# Patient Record
Sex: Male | Born: 2000 | Race: White | Hispanic: Yes | Marital: Single | Smoking: Current every day smoker
Health system: Southern US, Community
[De-identification: ages and names within clinical notes are randomized; demographics above are authoritative.]

## PROBLEM LIST (undated history)

## (undated) DIAGNOSIS — J309 Allergic rhinitis, unspecified: Secondary | ICD-10-CM

## (undated) DIAGNOSIS — K760 Fatty (change of) liver, not elsewhere classified: Secondary | ICD-10-CM

## (undated) DIAGNOSIS — L309 Dermatitis, unspecified: Secondary | ICD-10-CM

## (undated) DIAGNOSIS — K7689 Other specified diseases of liver: Secondary | ICD-10-CM

## (undated) DIAGNOSIS — K219 Gastro-esophageal reflux disease without esophagitis: Secondary | ICD-10-CM

## (undated) DIAGNOSIS — K741 Hepatic sclerosis: Secondary | ICD-10-CM

## (undated) DIAGNOSIS — K5900 Constipation, unspecified: Secondary | ICD-10-CM

## (undated) DIAGNOSIS — E663 Overweight: Secondary | ICD-10-CM

## (undated) HISTORY — DX: Fatty (change of) liver, not elsewhere classified: K76.0

## (undated) HISTORY — PX: LIVER BIOPSY: SHX301

## (undated) HISTORY — DX: Dermatitis, unspecified: L30.9

## (undated) HISTORY — DX: Constipation, unspecified: K59.00

## (undated) HISTORY — DX: Allergic rhinitis, unspecified: J30.9

## (undated) HISTORY — DX: Gastro-esophageal reflux disease without esophagitis: K21.9

## (undated) HISTORY — DX: Overweight: E66.3

---
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
## 2022-08-21 ENCOUNTER — Ambulatory Visit (INDEPENDENT_AMBULATORY_CARE_PROVIDER_SITE_OTHER): Payer: Medicaid Other | Admitting: Nurse Practitioner

## 2022-08-21 ENCOUNTER — Encounter: Payer: Self-pay | Admitting: Nurse Practitioner

## 2022-08-21 VITALS — BP 122/84 | HR 86 | Temp 97.4°F | Ht 72.0 in | Wt 280.0 lb

## 2022-08-21 DIAGNOSIS — E669 Obesity, unspecified: Secondary | ICD-10-CM | POA: Diagnosis not present

## 2022-08-21 DIAGNOSIS — Z6837 Body mass index (BMI) 37.0-37.9, adult: Secondary | ICD-10-CM | POA: Diagnosis not present

## 2022-08-21 DIAGNOSIS — J301 Allergic rhinitis due to pollen: Secondary | ICD-10-CM

## 2022-08-21 DIAGNOSIS — L83 Acanthosis nigricans: Secondary | ICD-10-CM | POA: Diagnosis not present

## 2022-08-21 DIAGNOSIS — K219 Gastro-esophageal reflux disease without esophagitis: Secondary | ICD-10-CM | POA: Diagnosis not present

## 2022-08-21 DIAGNOSIS — K760 Fatty (change of) liver, not elsewhere classified: Secondary | ICD-10-CM | POA: Diagnosis not present

## 2022-08-21 LAB — COMPREHENSIVE METABOLIC PANEL
ALT: 295 U/L — ABNORMAL HIGH (ref 0–53)
AST: 116 U/L — ABNORMAL HIGH (ref 0–37)
Albumin: 4.5 g/dL (ref 3.5–5.2)
Alkaline Phosphatase: 101 U/L (ref 52–171)
BUN: 12 mg/dL (ref 6–23)
CO2: 24 mEq/L (ref 19–32)
Calcium: 9.7 mg/dL (ref 8.4–10.5)
Chloride: 106 mEq/L (ref 96–112)
Creatinine, Ser: 0.73 mg/dL (ref 0.40–1.50)
GFR: 131.47 mL/min (ref >60.00)
Glucose, Bld: 108 mg/dL — ABNORMAL HIGH (ref 70–99)
Potassium: 4 mEq/L (ref 3.5–5.1)
Sodium: 140 mEq/L (ref 135–145)
Total Bilirubin: 0.6 mg/dL (ref 0.2–1.2)
Total Protein: 7.2 g/dL (ref 6.0–8.3)

## 2022-08-21 LAB — CBC
HCT: 45.2 % (ref 36.0–49.0)
Hemoglobin: 15.9 g/dL (ref 12.0–16.0)
MCHC: 35.1 g/dL (ref 31.0–37.0)
MCV: 88.3 fl (ref 78.0–98.0)
Platelets: 284 10*3/uL (ref 150.0–575.0)
RBC: 5.12 Mil/uL (ref 3.80–5.70)
RDW: 13.1 % (ref 11.4–15.5)
WBC: 7.9 10*3/uL (ref 4.5–13.5)

## 2022-08-21 LAB — HEMOGLOBIN A1C: Hgb A1c MFr Bld: 6.1 % (ref 4.6–6.5)

## 2022-08-21 MED ORDER — LORATADINE 10 MG PO TABS: 10.0000 mg | ORAL_TABLET | Freq: Every day | ORAL | 3 refills | Status: DC

## 2022-08-21 MED ORDER — FLUTICASONE PROPIONATE 50 MCG/ACT NA SUSP: 2.0000 | Freq: Every day | NASAL | 6 refills | Status: AC

## 2022-08-21 MED ORDER — FAMOTIDINE 20 MG PO TABS
20.0000 mg | ORAL_TABLET | Freq: Every day | ORAL | 1 refills | Status: DC
Start: 2022-08-21 — End: 2022-11-26

## 2022-08-21 NOTE — Patient Instructions (Signed)
It was great to see you!  Try to eat out less often and eat more home cooked meals.   I have placed a referral to a nutritionist.  We are checking your labs today and will let you know the results via mychart/phone.   I am going to send in claritin and flonase for your allergies  Let's follow-up in 3 months, sooner if you have concerns.  If a referral was placed today, you will be contacted for an appointment. Please note that routine referrals can sometimes take up to 3-4 weeks to process. Please call our office if you haven't heard anything after this time frame.  Take care,  Rodman Pickle, NP

## 2022-08-21 NOTE — Assessment & Plan Note (Signed)
Chronic, not controlled.  He states that Zyrtec is helping some, however makes him sleepy.  Will have him switch to loratadine 10 mg daily and start Flonase nasal spray daily.  Follow-up symptoms worsen or do not improve.

## 2022-08-21 NOTE — Assessment & Plan Note (Signed)
Chronic, ongoing.  He states that he tries to avoid spicy foods and to limit, how much he is eating.  Will have him restart Pepcid 20 mg daily as needed for symptoms.

## 2022-08-21 NOTE — Assessment & Plan Note (Signed)
Will check A1c today.  Was normal in the past, however has gained 70 pounds in the last year.

## 2022-08-21 NOTE — Progress Notes (Signed)
New Patient Visit  BP 122/84 (BP Location: Left Arm)   Pulse 86   Temp (!) 97.4 F (36.3 C)   Ht 6' (1.829 m)   Wt 280 lb (127 kg)   SpO2 95%   BMI 37.97 kg/m    Subjective:    Patient ID: Ronald Phillips, male    DOB: February 22, 2001, 22 y.o.   MRN: [id number]  CC: Chief Complaint  Patient presents with   Establish Care    NP. Est. Care, concerns with allergies    HPI: Ronald Phillips is a 22 y.o. male presents for new patient visit to establish care.  Introduced to Publishing rights manager role and practice setting.  All questions answered.  Discussed provider/patient relationship and expectations.  He has a history of NAFLD. He was following with pediatric GI with [facility], however they stopped calling him and he is no longer following with them. He last saw them about 1-2 years ago. He has had a liver biopsy and fibrosure scan in the past. He states that he has gained about 70 pounds over the last year. He started eating out more at restaurants.   He has trouble with seasonal allergies. He endorses nasal congestion and itchy eyes. He is taking zyrtec, but it makes him sleepy. He denies coughing and post nasal drip.   He has a history of GERD. He is not currently taking anything for this. If he eats too much food, eats too fast, or spicy foods, it will bother him. He has taken pepcid in the past and would like to restart this medications.   Depression and Anxiety Screen done:     08/21/2022   10:21 AM 03/28/2018   11:34 AM  Depression screen PHQ 2/9  Decreased Interest 0 0  Down, Depressed, Hopeless 0 0  PHQ - 2 Score 0 0  Altered sleeping 1 2  Tired, decreased energy 0 2  Change in appetite 1 1  Feeling bad or failure about yourself  0 0  Trouble concentrating 1 1  Moving slowly or fidgety/restless 0 0  Suicidal thoughts 0   PHQ-9 Score 3 6      08/21/2022   10:23 AM  GAD 7 : Generalized Anxiety Score  Nervous, Anxious, on Edge 1  Control/stop worrying 0  Worry too  much - different things 0  Trouble relaxing 0  Restless 0  Easily annoyed or irritable 1  Afraid - awful might happen 0  Total GAD 7 Score 2     Past Medical History:  Diagnosis Date   Allergic rhinitis 09/03/2012   Constipation 09/03/2012   Rx Miralax.  Instructions re: eat more fiber.     Eczema    Fatty liver    GERD (gastroesophageal reflux disease)    Hardening of the liver    Overweight(278.02) 09/03/2012    Past Surgical History:  Procedure Laterality Date   LIVER BIOPSY      Family History  Problem Relation Age of Onset   Healthy Mother    Healthy Father      Social History   Tobacco Use   Smoking status: Never    Passive exposure: Yes   Smokeless tobacco: Never   Tobacco comments:    Dad smokes outside  Vaping Use   Vaping Use: Every day   Substances: Nicotine  Substance Use Topics   Alcohol use: Yes    Comment: twice a month   Drug use: Not Currently    No current outpatient  medications on file prior to visit.   No current facility-administered medications on file prior to visit.     Review of Systems  Constitutional:  Positive for fatigue. Negative for fever.  HENT:  Positive for congestion. Negative for ear pain, postnasal drip and sore throat.   Respiratory: Negative.    Cardiovascular: Negative.   Gastrointestinal: Negative.   Genitourinary: Negative.   Musculoskeletal: Negative.   Skin: Negative.   Allergic/Immunologic: Positive for environmental allergies.  Neurological: Negative.   Psychiatric/Behavioral:  Negative for dysphoric mood. The patient is nervous/anxious.       Objective:    BP 122/84 (BP Location: Left Arm)   Pulse 86   Temp (!) 97.4 F (36.3 C)   Ht 6' (1.829 m)   Wt 280 lb (127 kg)   SpO2 95%   BMI 37.97 kg/m   Wt Readings from Last 3 Encounters:  08/21/22 280 lb (127 kg) (>99 %, Z= 2.82)*  05/08/21 226 lb (102.5 kg) (98 %, Z= 2.04)*  08/16/20 (!) 244 lb 2 oz (110.7 kg) (>99 %, Z= 2.40)*   * Growth  percentiles are based on CDC (Boys, 2-20 Years) data.    BP Readings from Last 3 Encounters:  08/21/22 122/84  05/12/21 120/76  05/08/21 121/77    Physical Exam Vitals and nursing note reviewed.  Constitutional:      General: He is not in acute distress.    Appearance: Normal appearance. He is obese.  HENT:     Head: Normocephalic and atraumatic.     Right Ear: Tympanic membrane, ear canal and external ear normal.     Left Ear: Tympanic membrane, ear canal and external ear normal.  Eyes:     Conjunctiva/sclera: Conjunctivae normal.  Cardiovascular:     Rate and Rhythm: Normal rate and regular rhythm.     Pulses: Normal pulses.     Heart sounds: Normal heart sounds.  Pulmonary:     Effort: Pulmonary effort is normal.     Breath sounds: Normal breath sounds.  Abdominal:     Palpations: Abdomen is soft.     Tenderness: There is no abdominal tenderness.  Musculoskeletal:        General: Normal range of motion.     Cervical back: Normal range of motion and neck supple. No tenderness.     Right lower leg: No edema.     Left lower leg: No edema.  Lymphadenopathy:     Cervical: No cervical adenopathy.  Skin:    General: Skin is warm and dry.  Neurological:     General: No focal deficit present.     Mental Status: He is alert and oriented to person, place, and time.     Cranial Nerves: No cranial nerve deficit.     Coordination: Coordination normal.     Gait: Gait normal.  Psychiatric:        Mood and Affect: Mood normal.        Behavior: Behavior normal.        Thought Content: Thought content normal.        Judgment: Judgment normal.        Assessment & Plan:   Problem List Items Addressed This Visit       Respiratory   Seasonal allergic rhinitis due to pollen    Chronic, not controlled.  He states that Zyrtec is helping some, however makes him sleepy.  Will have him switch to loratadine 10 mg daily and start Flonase nasal spray daily.  Follow-up symptoms worsen or  do not improve.        Digestive   Fatty liver disease, nonalcoholic - Primary    He was being followed by pediatric GI at [facility] and had a Fibroscan done 06/13/2021 which showed a score of F0-F1.  He has not been back since.  He states that over the last year he has gained almost 70 pounds.  Recommend weight loss and healthy eating.  Will place a referral to a nutritionist.  Check CMP, CBC today.  Follow-up in 3 months.      Relevant Orders   CBC (Completed)   Comprehensive metabolic panel (Completed)   Amb ref to Medical Nutrition Therapy-MNT   Gastroesophageal reflux disease    Chronic, ongoing.  He states that he tries to avoid spicy foods and to limit, how much he is eating.  Will have him restart Pepcid 20 mg daily as needed for symptoms.      Relevant Medications   famotidine (PEPCID) 20 MG tablet     Musculoskeletal and Integument   Acanthosis nigricans    Will check A1c today.  Was normal in the past, however has gained 70 pounds in the last year.      Relevant Orders   Hemoglobin A1c (Completed)     Other   Obesity (BMI 30-39.9)    BMI 37.9.  Discussed nutrition and exercise.  Referral placed to nutritionist today.        Follow up plan: Return in about 3 months (around 11/20/2022) for CPE.

## 2022-08-21 NOTE — Assessment & Plan Note (Signed)
He was being followed by pediatric GI at [facility] and had a Fibroscan done 06/13/2021 which showed a score of F0-F1.  He has not been back since.  He states that over the last year he has gained almost 70 pounds.  Recommend weight loss and healthy eating.  Will place a referral to a nutritionist.  Check CMP, CBC today.  Follow-up in 3 months.

## 2022-08-21 NOTE — Assessment & Plan Note (Signed)
BMI 37.9.  Discussed nutrition and exercise.  Referral placed to nutritionist today.

## 2022-10-17 ENCOUNTER — Ambulatory Visit: Payer: Medicaid Other | Admitting: Skilled Nursing Facility1

## 2022-11-26 ENCOUNTER — Encounter: Payer: Self-pay | Admitting: Nurse Practitioner

## 2022-11-26 ENCOUNTER — Ambulatory Visit (INDEPENDENT_AMBULATORY_CARE_PROVIDER_SITE_OTHER): Payer: Medicaid Other | Admitting: Nurse Practitioner

## 2022-11-26 VITALS — BP 130/62 | HR 75 | Temp 97.8°F | Ht 72.0 in | Wt 279.4 lb

## 2022-11-26 DIAGNOSIS — E669 Obesity, unspecified: Secondary | ICD-10-CM

## 2022-11-26 DIAGNOSIS — K760 Fatty (change of) liver, not elsewhere classified: Secondary | ICD-10-CM | POA: Diagnosis not present

## 2022-11-26 DIAGNOSIS — R7303 Prediabetes: Secondary | ICD-10-CM | POA: Diagnosis not present

## 2022-11-26 DIAGNOSIS — L739 Follicular disorder, unspecified: Secondary | ICD-10-CM | POA: Insufficient documentation

## 2022-11-26 DIAGNOSIS — Z0001 Encounter for general adult medical examination with abnormal findings: Secondary | ICD-10-CM | POA: Diagnosis not present

## 2022-11-26 DIAGNOSIS — L237 Allergic contact dermatitis due to plants, except food: Secondary | ICD-10-CM | POA: Diagnosis not present

## 2022-11-26 DIAGNOSIS — G8929 Other chronic pain: Secondary | ICD-10-CM

## 2022-11-26 DIAGNOSIS — K219 Gastro-esophageal reflux disease without esophagitis: Secondary | ICD-10-CM | POA: Diagnosis not present

## 2022-11-26 DIAGNOSIS — M545 Low back pain, unspecified: Secondary | ICD-10-CM

## 2022-11-26 DIAGNOSIS — Z0000 Encounter for general adult medical examination without abnormal findings: Secondary | ICD-10-CM | POA: Insufficient documentation

## 2022-11-26 MED ORDER — TRIAMCINOLONE ACETONIDE 0.1 % EX CREA: 1.0000 | TOPICAL_CREAM | Freq: Two times a day (BID) | CUTANEOUS | 0 refills | Status: DC

## 2022-11-26 MED ORDER — DOXYCYCLINE HYCLATE 100 MG PO TABS: 100.0000 mg | ORAL_TABLET | Freq: Two times a day (BID) | ORAL | 0 refills | Status: DC

## 2022-11-26 MED ORDER — FAMOTIDINE 20 MG PO TABS: 20.0000 mg | ORAL_TABLET | Freq: Every day | ORAL | 1 refills | Status: DC

## 2022-11-26 NOTE — Assessment & Plan Note (Signed)
Chronic, stable.  Last A1c was 6.1%.  Discussed nutrition and weight loss.

## 2022-11-26 NOTE — Assessment & Plan Note (Signed)
Folliculitis noted to scalp.  Recommend that he continue washing his hair and taking extra shower if needed if he gets sweaty.  Will have him start doxycycline 100 mg twice a day for 7 days.

## 2022-11-26 NOTE — Assessment & Plan Note (Signed)
Chronic, stable.  His last FibroScan showed a fibrosis score of F0-F1 on 06/13/2021.  Will repeat this in 3 years.  Encouraged weight loss.

## 2022-11-26 NOTE — Assessment & Plan Note (Signed)
Chronic, stable.  He has been having lower back pain that will flareup intermittently and states that this has happened 3 times over the last year.  Will give him some back exercises to do daily.  He can take naproxen 500 mg twice a day when this flares up.  Will also place a referral to chiropractor for his request.  Follow-up with any concerns.

## 2022-11-26 NOTE — Patient Instructions (Signed)
It was great to see you!  Start triamcinolone cream twice a day to your poison ivy  Start doxycycline twice a day with food for your head.  I have placed a referral to a chiropractor. Start the attached stretches daily. You can also take naproxen 500mg  twice a day with food as needed for pain.   Let's follow-up in 1 year, sooner if you have concerns.  If a referral was placed today, you will be contacted for an appointment. Please note that routine referrals can sometimes take up to 3-4 weeks to process. Please call our office if you haven't heard anything after this time frame.  Take care,  Rodman Pickle, NP

## 2022-11-26 NOTE — Assessment & Plan Note (Signed)
Chronic, stable.  Continue Pepcid 20 mg daily as needed.  Refill sent to the pharmacy.

## 2022-11-26 NOTE — Assessment & Plan Note (Signed)
BMI 37.8.  Discussed nutrition and exercise.

## 2022-11-26 NOTE — Progress Notes (Signed)
BP 130/62 (BP Location: Left Arm)   Pulse 75   Temp 97.8 F (36.6 C)   Ht 6' (1.829 m)   Wt 279 lb 6.4 oz (126.7 kg)   SpO2 97%   BMI 37.89 kg/m    Subjective:    Patient ID: Ronald Phillips, male    DOB: April 30, 2000, 22 y.o.   MRN: [id number]  CC: Chief Complaint  Patient presents with   Annual Exam    Concerns with poison ivy on left lower leg and left wrist    HPI: Ronald Phillips is a 22 y.o. male presenting on 11/26/2022 for comprehensive medical examination. Current medical complaints include: rash on his wrist and back of scalp   He went camping this weekend and believes he got into poison ivy and has a rash on his left wrist and ankle.  He has been putting Benadryl cream on it, however it is not helping and it is spreading.  He states that it is itching.  He denies pain and fevers.  He also notes bumps on the back of his head and scalp that tend to come and go.  He states that when he sweats they get worse.  He has tried to pop them and get them to drain.  He denies fevers.  He states that they will itch at times.  Depression and Anxiety Screen done today and results listed below:     11/26/2022   10:52 AM 08/21/2022   10:21 AM 03/28/2018   11:34 AM  Depression screen PHQ 2/9  Decreased Interest 1 0 0  Down, Depressed, Hopeless 0 0 0  PHQ - 2 Score 1 0 0  Altered sleeping 0 1 2  Tired, decreased energy 1 0 2  Change in appetite 1 1 1   Feeling bad or failure about yourself  0 0 0  Trouble concentrating 3 1 1   Moving slowly or fidgety/restless 1 0 0  Suicidal thoughts 0 0   PHQ-9 Score 7 3 6   Difficult doing work/chores Somewhat difficult        11/26/2022   10:52 AM 08/21/2022   10:23 AM  GAD 7 : Generalized Anxiety Score  Nervous, Anxious, on Edge 2 1  Control/stop worrying 0 0  Worry too much - different things 0 0  Trouble relaxing 2 0  Restless 0 0  Easily annoyed or irritable 1 1  Afraid - awful might happen 0 0  Total GAD 7 Score 5 2  Anxiety Difficulty  Somewhat difficult     The patient does not have a history of falls. I did not complete a risk assessment for falls. A plan of care for falls was not documented.   Past Medical History:  Past Medical History:  Diagnosis Date   Allergic rhinitis 09/03/2012   Constipation 09/03/2012   Rx Miralax.  Instructions re: eat more fiber.     Eczema    Fatty liver    GERD (gastroesophageal reflux disease)    Hardening of the liver    Overweight(278.02) 09/03/2012    Surgical History:  Past Surgical History:  Procedure Laterality Date   LIVER BIOPSY      Medications:  Current Outpatient Medications on File Prior to Visit  Medication Sig   fluticasone (FLONASE) 50 MCG/ACT nasal spray Place 2 sprays into both nostrils daily. (Patient not taking: Reported on 11/26/2022)   loratadine (CLARITIN) 10 MG tablet Take 1 tablet (10 mg total) by mouth daily. (Patient not taking: Reported on 11/26/2022)  No current facility-administered medications on file prior to visit.    Allergies:  Allergies  Allergen Reactions   Omnicef [Cefdinir] Rash    Per mom patient has tolerated penicillin in the past    Social History:  Social History   Socioeconomic History   Marital status: Single    Spouse name: Not on file   Number of children: Not on file   Years of education: Not on file   Highest education level: Not on file  Occupational History   Not on file  Tobacco Use   Smoking status: Former    Types: Cigarettes    Passive exposure: Yes   Smokeless tobacco: Current   Tobacco comments:    Dad smokes outside,Patient vapes  Vaping Use   Vaping status: Every Day   Substances: Nicotine  Substance and Sexual Activity   Alcohol use: Yes    Comment: twice a month   Drug use: Not Currently   Sexual activity: Yes    Birth control/protection: Condom  Other Topics Concern   Not on file  Social History Narrative   Not on file   Social Determinants of Health   Financial Resource Strain: Not  on file  Food Insecurity: No Food Insecurity (07/28/2020)   Received from [facility] visits prior to 06/23/2022., [facility] visits prior to 06/23/2022.   Hunger Vital Sign    Worried About Programme researcher, broadcasting/film/video in the Last Year: Never true    Ran Out of Food in the Last Year: Never true  Transportation Needs: Not on file  Physical Activity: Not on file  Stress: Not on file  Social Connections: Not on file  Intimate Partner Violence: Not on file   Social History   Tobacco Use  Smoking Status Former   Types: Cigarettes   Passive exposure: Yes  Smokeless Tobacco Current  Tobacco Comments   Dad smokes outside,Patient vapes   Social History   Substance and Sexual Activity  Alcohol Use Yes   Comment: twice a month    Family History:  Family History  Problem Relation Age of Onset   Healthy Mother    Healthy Father     Past medical history, surgical history, medications, allergies, family history and social history reviewed with patient today and changes made to appropriate areas of the chart.   Review of Systems  Constitutional: Negative.   HENT: Negative.    Eyes: Negative.   Respiratory: Negative.    Cardiovascular: Negative.   Gastrointestinal: Negative.   Genitourinary: Negative.   Musculoskeletal: Negative.   Skin:  Positive for itching and rash (left wrist, left ankle).  Neurological: Negative.   Psychiatric/Behavioral: Negative.     All other ROS negative except what is listed above and in the HPI.      Objective:    BP 130/62 (BP Location: Left Arm)   Pulse 75   Temp 97.8 F (36.6 C)   Ht 6' (1.829 m)   Wt 279 lb 6.4 oz (126.7 kg)   SpO2 97%   BMI 37.89 kg/m   Wt Readings from Last 3 Encounters:  11/26/22 279 lb 6.4 oz (126.7 kg)  08/21/22 280 lb (127 kg) (>99%, Z= 2.82)*  05/08/21 226 lb (102.5 kg) (98%, Z= 2.04)*   * Growth percentiles are based on CDC (Boys, 2-20 Years) data.    Physical Exam Vitals  and nursing note reviewed.  Constitutional:      General: He is not in  acute distress.    Appearance: Normal appearance. He is obese.  HENT:     Head: Normocephalic and atraumatic.     Right Ear: Tympanic membrane, ear canal and external ear normal.     Left Ear: Tympanic membrane, ear canal and external ear normal.  Eyes:     Conjunctiva/sclera: Conjunctivae normal.  Cardiovascular:     Rate and Rhythm: Normal rate and regular rhythm.     Pulses: Normal pulses.     Heart sounds: Normal heart sounds.  Pulmonary:     Effort: Pulmonary effort is normal.     Breath sounds: Normal breath sounds.  Abdominal:     Palpations: Abdomen is soft.     Tenderness: There is no abdominal tenderness.  Musculoskeletal:        General: Normal range of motion.     Cervical back: Normal range of motion and neck supple. No tenderness.     Right lower leg: No edema.     Left lower leg: No edema.  Lymphadenopathy:     Cervical: No cervical adenopathy.  Skin:    General: Skin is warm and dry.     Findings: Rash (left wrist, foliculitis to scalp) present.  Neurological:     General: No focal deficit present.     Mental Status: He is alert and oriented to person, place, and time.     Cranial Nerves: No cranial nerve deficit.     Coordination: Coordination normal.     Gait: Gait normal.  Psychiatric:        Mood and Affect: Mood normal.        Behavior: Behavior normal.        Thought Content: Thought content normal.        Judgment: Judgment normal.     Results for orders placed or performed in visit on 08/21/22  CBC  Result Value Ref Range   WBC 7.9 4.5 - 13.5 K/uL   RBC 5.12 3.80 - 5.70 Mil/uL   Platelets 284.0 150.0 - 575.0 K/uL   Hemoglobin 15.9 12.0 - 16.0 g/dL   HCT 08.6 57.8 - 46.9 %   MCV 88.3 78.0 - 98.0 fl   MCHC 35.1 31.0 - 37.0 g/dL   RDW 62.9 52.8 - 41.3 %  Comprehensive metabolic panel  Result Value Ref Range   Sodium 140 135 - 145 mEq/L   Potassium 4.0 3.5 - 5.1 mEq/L    Chloride 106 96 - 112 mEq/L   CO2 24 19 - 32 mEq/L   Glucose, Bld 108 (H) 70 - 99 mg/dL   BUN 12 6 - 23 mg/dL   Creatinine, Ser 2.44 0.40 - 1.50 mg/dL   Total Bilirubin 0.6 0.2 - 1.2 mg/dL   Alkaline Phosphatase 101 52 - 171 U/L   AST 116 (H) 0 - 37 U/L   ALT 295 (H) 0 - 53 U/L   Total Protein 7.2 6.0 - 8.3 g/dL   Albumin 4.5 3.5 - 5.2 g/dL   GFR 010.27 >25.36 mL/min   Calcium 9.7 8.4 - 10.5 mg/dL  Hemoglobin U4Q  Result Value Ref Range   Hgb A1c MFr Bld 6.1 4.6 - 6.5 %      Assessment & Plan:   Problem List Items Addressed This Visit       Digestive   Fatty liver disease, nonalcoholic    Chronic, stable.  His last FibroScan showed a fibrosis score of F0-F1 on 06/13/2021.  Will repeat this in 3 years.  Encouraged  weight loss.      Gastroesophageal reflux disease    Chronic, stable.  Continue Pepcid 20 mg daily as needed.  Refill sent to the pharmacy.      Relevant Medications   famotidine (PEPCID) 20 MG tablet     Musculoskeletal and Integument   Folliculitis    Folliculitis noted to scalp.  Recommend that he continue washing his hair and taking extra shower if needed if he gets sweaty.  Will have him start doxycycline 100 mg twice a day for 7 days.         Other   Obesity (BMI 30-39.9)    BMI 37.8.  Discussed nutrition and exercise.      Bilateral low back pain without sciatica    Chronic, stable.  He has been having lower back pain that will flareup intermittently and states that this has happened 3 times over the last year.  Will give him some back exercises to do daily.  He can take naproxen 500 mg twice a day when this flares up.  Will also place a referral to chiropractor for his request.  Follow-up with any concerns.      Relevant Orders   Ambulatory referral to Chiropractic   Prediabetes    Chronic, stable.  Last A1c was 6.1%.  Discussed nutrition and weight loss.      Other Visit Diagnoses     Encounter for general adult medical examination with  abnormal findings    -  Primary   Health maintenance reviewed and updated.  Discussed nutrition, exercise.  Follow-up 1 year   Poison ivy dermatitis       Will treat with triamcinolone cream twice a day to affected area.  Wash hands frequently.  Follow-up if worsening.        IMMUNIZATIONS:   - Tdap: Tetanus vaccination status reviewed: last tetanus booster within 10 years. - Influenza: Postponed to flu season - Pneumovax: Not applicable - Prevnar: Not applicable - HPV: Up to date - Zostavax vaccine: Not applicable  SCREENING: - Colonoscopy: Not applicable  Discussed with patient purpose of the colonoscopy is to detect colon cancer at curable precancerous or early stages   - AAA Screening: Not applicable   PATIENT COUNSELING:    Sexuality: Discussed sexually transmitted diseases, partner selection, use of condoms, avoidance of unintended pregnancy  and contraceptive alternatives.   Advised to avoid cigarette smoking.  I discussed with the patient that most people either abstain from alcohol or drink within safe limits (<=14/week and <=4 drinks/occasion for males, <=7/weeks and <= 3 drinks/occasion for females) and that the risk for alcohol disorders and other health effects rises proportionally with the number of drinks per week and how often a drinker exceeds daily limits.  Discussed cessation/primary prevention of drug use and availability of treatment for abuse.   Diet: Encouraged to adjust caloric intake to maintain  or achieve ideal body weight, to reduce intake of dietary saturated fat and total fat, to limit sodium intake by avoiding high sodium foods and not adding table salt, and to maintain adequate dietary potassium and calcium preferably from fresh fruits, vegetables, and low-fat dairy products.    stressed the importance of regular exercise  Injury prevention: Discussed safety belts, safety helmets, smoke detector, smoking near bedding or upholstery.   Dental  health: Discussed importance of regular tooth brushing, flossing, and dental visits.   Follow up plan: NEXT PREVENTATIVE PHYSICAL DUE IN 1 YEAR. Return in about 1 year (around 11/26/2023) for CPE.  Ronald Phillips

## 2022-12-05 ENCOUNTER — Telehealth: Payer: Self-pay

## 2022-12-05 ENCOUNTER — Other Ambulatory Visit: Payer: Self-pay | Admitting: Nurse Practitioner

## 2022-12-05 DIAGNOSIS — G8929 Other chronic pain: Secondary | ICD-10-CM

## 2022-12-05 DIAGNOSIS — M545 Low back pain, unspecified: Secondary | ICD-10-CM

## 2022-12-05 NOTE — Progress Notes (Signed)
Chiropractor office sent message that patient is requesting to get x-rays of lumbar and cervical spine through PCP. Orders placed today.

## 2022-12-05 NOTE — Telephone Encounter (Signed)
LVM for patient to return call. 

## 2022-12-06 NOTE — Telephone Encounter (Signed)
I called and spoke with patient and he said that x-rays were ordered and it was going to be a lot to pay for there at the chiropractic center and the physician said he could get it done at his PCP's office. Patient said he is just going to wait as he is trying to save money.

## 2022-12-10 NOTE — Telephone Encounter (Signed)
I called and notified patient of message and he said that he will not be able to get the x-rays done because he believes his Medicaid will not cover them. He said he is just going to put it on the back burner for now.

## 2022-12-10 NOTE — Telephone Encounter (Signed)
Noted  

## 2023-01-21 ENCOUNTER — Emergency Department (HOSPITAL_BASED_OUTPATIENT_CLINIC_OR_DEPARTMENT_OTHER): Payer: Medicaid Other | Admitting: Radiology

## 2023-01-21 ENCOUNTER — Emergency Department (HOSPITAL_BASED_OUTPATIENT_CLINIC_OR_DEPARTMENT_OTHER)
Admission: EM | Admit: 2023-01-21 | Discharge: 2023-01-21 | Disposition: A | Discharge status: Home / Self Care | Payer: Medicaid Other | Attending: Emergency Medicine | Admitting: Emergency Medicine

## 2023-01-21 ENCOUNTER — Other Ambulatory Visit: Payer: Self-pay

## 2023-01-21 ENCOUNTER — Encounter (HOSPITAL_BASED_OUTPATIENT_CLINIC_OR_DEPARTMENT_OTHER): Payer: Self-pay | Admitting: *Deleted

## 2023-01-21 DIAGNOSIS — Z0189 Encounter for other specified special examinations: Secondary | ICD-10-CM | POA: Diagnosis not present

## 2023-01-21 DIAGNOSIS — S6991XA Unspecified injury of right wrist, hand and finger(s), initial encounter: Secondary | ICD-10-CM | POA: Diagnosis present

## 2023-01-21 DIAGNOSIS — S61212A Laceration without foreign body of right middle finger without damage to nail, initial encounter: Secondary | ICD-10-CM | POA: Diagnosis not present

## 2023-01-21 DIAGNOSIS — W293XXA Contact with powered garden and outdoor hand tools and machinery, initial encounter: Secondary | ICD-10-CM | POA: Insufficient documentation

## 2023-01-21 MED ORDER — LIDOCAINE HCL (PF) 1 % IJ SOLN
30.0000 mL | Freq: Once | INTRAMUSCULAR | Status: DC
  Filled 2023-01-21: qty 30

## 2023-01-21 MED ORDER — LIDOCAINE HCL 1 % IJ SOLN
INTRAMUSCULAR | Status: AC
  Filled 2023-01-21: qty 40

## 2023-01-21 NOTE — Discharge Instructions (Signed)
You have been seen today for your complaint of finger laceration. Your imaging was negative for any fractures or foreign bodies. Your discharge medications include Alternate tylenol and ibuprofen for pain. You may alternate these every 4 hours. You may take up to 800 mg of ibuprofen at a time and up to 1000 mg of tylenol. Home care instructions are as follows:  Keep the area clean and dry for 24 hours.  Clean with soapy water once daily afterwards Follow up with: Your PCP, any urgent care or emergency department in 7 days for suture removal Please seek immediate medical care if you develop any of the following symptoms: You have very bad swelling around the wound. Your pain suddenly gets worse and is very bad. You have painful lumps near the wound or on skin anywhere on your body. You have a red streak going away from your wound. The wound is on your hand or foot, and: You cannot move a finger or toe. Your fingers or toes look pale or bluish. At this time there does not appear to be the presence of an emergent medical condition, however there is always the potential for conditions to change. Please read and follow the below instructions.  Do not take your medicine if  develop an itchy rash, swelling in your mouth or lips, or difficulty breathing; call 911 and seek immediate emergency medical attention if this occurs.  You may review your lab tests and imaging results in their entirety on your MyChart account.  Please discuss all results of fully with your primary care provider and other specialist at your follow-up visit.  Note: Portions of this text may have been transcribed using voice recognition software. Every effort was made to ensure accuracy; however, inadvertent computerized transcription errors may still be present.

## 2023-01-21 NOTE — ED Triage Notes (Signed)
Pt has laceration to right middle finger after injuring himself while installing a new blade to a wood chipper. Bleeding controlled, pt states that the finger feels numb right now.  Last tetanus vaccine was 2 years ago.

## 2023-01-21 NOTE — ED Notes (Signed)
What appears to be a 2cm laceration noted to right 3rd finger. Bleeding controlled. No bone or tendons noted to laceration at this time. Patient able to move right 3rd finger but reports pain 10/10 with movement. Capillary refill <3 seconds, color WNL.

## 2023-01-21 NOTE — ED Provider Notes (Signed)
[place] EMERGENCY DEPARTMENT AT [facility] Provider Note   CSN: [id number] Arrival date & time: 01/21/23  1659     History  Chief Complaint  Patient presents with   Extremity Laceration    Ronald Phillips is a 22 y.o. male.  Presenting to the ED for evaluation of right middle finger laceration.  He states he was cleaning his neighbors tree trimmer when he cut his finger on the blade.  This occurred just prior to arrival.  He states the finger felt tingly after the incident but this has since improved.  States his last tetanus vaccine was 2 years ago.  HPI     Home Medications Prior to Admission medications   Medication Sig Start Date End Date Taking? Authorizing Provider  doxycycline (VIBRA-TABS) 100 MG tablet Take 1 tablet (100 mg total) by mouth 2 (two) times daily. 11/26/22   McElwee, Lauren A, NP  famotidine (PEPCID) 20 MG tablet Take 1 tablet (20 mg total) by mouth daily. 11/26/22   McElwee, Lauren A, NP  fluticasone (FLONASE) 50 MCG/ACT nasal spray Place 2 sprays into both nostrils daily. Patient not taking: Reported on 11/26/2022 08/21/22   Gerre Scull, NP  loratadine (CLARITIN) 10 MG tablet Take 1 tablet (10 mg total) by mouth daily. Patient not taking: Reported on 11/26/2022 08/21/22   Rodman Pickle A, NP  triamcinolone cream (KENALOG) 0.1 % Apply 1 Application topically 2 (two) times daily. 11/26/22   McElwee, Jake Church, NP      Allergies    Omnicef [cefdinir]    Review of Systems   Review of Systems  Skin:  Positive for wound.  All other systems reviewed and are negative.   Physical Exam Updated Vital Signs BP (!) 141/83   Pulse 79   Temp 98.6 F (37 C) (Oral)   Resp 18   SpO2 99%  Physical Exam Vitals and nursing note reviewed.  Constitutional:      General: He is not in acute distress.    Appearance: Normal appearance. He is normal weight. He is not ill-appearing.  HENT:     Head: Normocephalic and atraumatic.  Pulmonary:     Effort:  Pulmonary effort is normal. No respiratory distress.  Abdominal:     General: Abdomen is flat.  Musculoskeletal:        General: Normal range of motion.     Cervical back: Neck supple.     Comments: Full AROM in flexion and extension of all digits of bilateral hands.  Capillary refill normal.  Sensation intact in all digits.  Skin:    General: Skin is warm and dry.     Comments: 3 cm superficial laceration to the palmar surface of the right middle finger overlying the distal phalanx.  No active bleeding.  Neurological:     Mental Status: He is alert and oriented to person, place, and time.  Psychiatric:        Mood and Affect: Mood normal.        Behavior: Behavior normal.     ED Results / Procedures / Treatments   Labs (all labs ordered are listed, but only abnormal results are displayed) Labs Reviewed - No data to display  EKG None  Radiology DG Finger Middle Right  Result Date: 01/21/2023 CLINICAL DATA:  laceration from blade on wood chipper EXAM: RIGHT MIDDLE FINGER 2+V COMPARISON:  None Available. FINDINGS: There is no evidence of fracture or dislocation. There is no evidence of arthropathy or other focal bone  abnormality. A dressing overlies the mid and distal aspect of the digit. No radiopaque foreign body or soft tissue gas. Site of laceration is not well-defined by radiograph. IMPRESSION: No fracture or radiopaque foreign body. Site of laceration is not well-defined by radiograph. Electronically Signed   By: Narda Rutherford M.D.   On: 01/21/2023 19:30    Procedures .Marland KitchenLaceration Repair  Date/Time: 01/21/2023 10:48 PM  Performed by: Michelle Piper, PA-C Authorized by: Michelle Piper, PA-C   Consent:    Consent obtained:  Verbal   Consent given by:  Patient   Risks discussed:  Infection, pain and poor cosmetic result   Alternatives discussed:  No treatment Universal protocol:    Procedure explained and questions answered to patient or proxy's satisfaction:  yes     Relevant documents present and verified: yes     Imaging studies available: yes     Immediately prior to procedure, a time out was called: yes     Patient identity confirmed:  Verbally with patient and arm band Anesthesia:    Anesthesia method:  Nerve block   Block location:  R indexfinger   Block anesthetic:  Lidocaine 1% w/o epi   Block technique:  Digital ring   Block injection procedure:  Introduced needle, anatomic landmarks identified, incremental injection, negative aspiration for blood and anatomic landmarks palpated   Block outcome:  Anesthesia achieved Laceration details:    Location:  Finger   Finger location:  R long finger   Length (cm):  3 Pre-procedure details:    Preparation:  Imaging obtained to evaluate for foreign bodies Exploration:    Hemostasis achieved with:  Tourniquet   Imaging obtained: x-ray     Imaging outcome: foreign body not noted     Wound exploration: wound explored through full range of motion   Treatment:    Area cleansed with:  Soap and water and saline   Amount of cleaning:  Standard   Irrigation solution:  Sterile water   Irrigation volume:  1000   Irrigation method:  Pressure wash Skin repair:    Repair method:  Sutures   Suture size:  5-0   Suture material:  Nylon   Suture technique:  Simple interrupted   Number of sutures:  5 Approximation:    Approximation:  Close Repair type:    Repair type:  Simple Post-procedure details:    Dressing:  Non-adherent dressing   Procedure completion:  Tolerated well, no immediate complications     Medications Ordered in ED Medications  lidocaine (XYLOCAINE) 1 % (with pres) injection (  Given by Other 01/21/23 2235)    ED Course/ Medical Decision Making/ A&P                                 Medical Decision Making Amount and/or Complexity of Data Reviewed Radiology: ordered.  Risk Prescription drug management.   This patient presents to the ED for concern of finger laceration,  this involves an extensive number of treatment options, and is a complaint that carries with it a high risk of complications and morbidity.  The differential diagnosis includes superficial laceration, tendon/ligament rupture  My initial workup includes imaging, wound closure  Additional history obtained from: Nursing notes from this visit.  I ordered imaging studies including x-ray right middle finger I independently visualized and interpreted imaging which showed negative I agree with the radiologist interpretation  Afebrile, hemodynamically stable.  22 year old  male presenting to the ED for evaluation of right middle finger laceration.  This is over the palmar surface overlying the proximal phalanx.  No active bleeding.  He is able to fully flex and extend the digit.  Sensation is intact.  X-ray negative for fractures or foreign bodies.  Tetanus vaccine is up-to-date.  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  Extensive cleaning was performed.  Laceration was repaired as stated above.  Patient was encouraged to return in 7 days for suture removal.  He was given return precautions including signs and symptoms of an infection.  Stable at discharge.  At this time there does not appear to be any evidence of an acute emergency medical condition and the patient appears stable for discharge with appropriate outpatient follow up. Diagnosis was discussed with patient who verbalizes understanding of care plan and is agreeable to discharge. I have discussed return precautions with patient who verbalizes understanding. Patient encouraged to follow-up with their PCP within 1 week. All questions answered.  Note: Portions of this report may have been transcribed using voice recognition software. Every effort was made to ensure accuracy; however, inadvertent computerized transcription errors may still be present.        Final Clinical Impression(s) / ED Diagnoses Final diagnoses:   Laceration of right middle finger without foreign body without damage to nail, initial encounter    Rx / DC Orders ED Discharge Orders     None         Michelle Piper, Cordelia Poche 01/21/23 2252    Alvira Monday, MD 01/23/23 1330

## 2023-01-21 NOTE — ED Notes (Signed)
Laceration dressed with bacitracin, telfa, gauze, and coban. ROM intact, color WNL.

## 2023-01-30 ENCOUNTER — Ambulatory Visit (INDEPENDENT_AMBULATORY_CARE_PROVIDER_SITE_OTHER): Payer: Medicaid Other | Admitting: Nurse Practitioner

## 2023-01-30 ENCOUNTER — Encounter: Payer: Self-pay | Admitting: Nurse Practitioner

## 2023-01-30 VITALS — BP 114/68 | HR 90 | Temp 97.2°F | Ht 72.0 in | Wt 277.4 lb

## 2023-01-30 DIAGNOSIS — Z4802 Encounter for removal of sutures: Secondary | ICD-10-CM | POA: Diagnosis not present

## 2023-01-30 DIAGNOSIS — S61212A Laceration without foreign body of right middle finger without damage to nail, initial encounter: Secondary | ICD-10-CM | POA: Insufficient documentation

## 2023-01-30 DIAGNOSIS — L700 Acne vulgaris: Secondary | ICD-10-CM | POA: Diagnosis not present

## 2023-01-30 DIAGNOSIS — S61212D Laceration without foreign body of right middle finger without damage to nail, subsequent encounter: Secondary | ICD-10-CM | POA: Diagnosis not present

## 2023-01-30 MED ORDER — DOXYCYCLINE HYCLATE 100 MG PO TABS: ORAL_TABLET | ORAL | 0 refills | Status: DC

## 2023-01-30 NOTE — Patient Instructions (Signed)
It was great to see you!  Start doxycycline twice a day for 7 days, then 1 tablet daily.  Keep your hand clean with soap and water and cover while at work.   Let's follow-up if your symptoms worsen or any concerns.   Take care,  Rodman Pickle, NP

## 2023-01-30 NOTE — Progress Notes (Signed)
Acute Office Visit  Subjective:     Patient ID: Ronald Phillips, male    DOB: [birth year], 22 y.o.   MRN: [id number]  Chief Complaint  Patient presents with   Hand Injury    Pt was seen in the ED and received stitches last Monday on 01/21/2023. Would poss like sutures removed.     Hand Injury    Patient is in today for is here to follow-up on right middle finger injury.  He states that on 01/21/2023, he cut himself on a blade from a wood chipper.  He went to the ER and received 5 stitches.  He had an x-ray that was negative for fracture.  He is here for the sutures to be removed today and he has also been having some slight yellow drainage from the laceration.  He denies fevers, redness.  He also notes that when he took the doxycycline last time, it helped with the folliculitis on the back of his head, however it came back when he stopped the antibiotic.  He is still been watching his hair regularly.  He notes that she is more in his scalp now than on the back of his neck.  ROS See pertinent positives and negatives per HPI.    Objective:    BP 114/68   Pulse 90   Temp (!) 97.2 F (36.2 C) (Temporal)   Ht 6' (1.829 m)   Wt 277 lb 6.4 oz (125.8 kg)   SpO2 98%   BMI 37.62 kg/m    Physical Exam Vitals and nursing note reviewed.  Constitutional:      Appearance: Normal appearance.  HENT:     Head: Normocephalic.  Eyes:     Conjunctiva/sclera: Conjunctivae normal.  Cardiovascular:     Rate and Rhythm: Normal rate and regular rhythm.     Pulses: Normal pulses.     Heart sounds: Normal heart sounds.  Pulmonary:     Effort: Pulmonary effort is normal.     Breath sounds: Normal breath sounds.  Musculoskeletal:     Cervical back: Normal range of motion.  Skin:    General: Skin is warm.     Comments: Incision to right middle finger no signs of infection and well approximated. Acne/folliculitis noted throughout scalp.  Neurological:     General: No focal deficit present.      Mental Status: He is alert and oriented to person, place, and time.  Psychiatric:        Mood and Affect: Mood normal.        Behavior: Behavior normal.        Thought Content: Thought content normal.        Judgment: Judgment normal.       Assessment & Plan:   Problem List Items Addressed This Visit       Musculoskeletal and Integument   Acne vulgaris - Primary    Doxycycline had helped while taking it, however returned when he finished the course. Will start doxycycline 100mg  BID x7 days for laceration, then once daily for 3 more weeks for acne. Follow-up with any concerns.       Relevant Medications   doxycycline (VIBRA-TABS) 100 MG tablet     Other   Laceration of right middle finger without foreign body without damage to nail    He cut his finger on 01/21/23 using a wood chipper. He went to ER and had negative x-ray of his finger. He received 5 sutures. He states he is having  some yellow drainage from it. No redness, or signs of drainage today. With acne treating with doxycycline will start doxycycline BID x7 days. Keep area covered while at work and wash with soap and water. After obtaining verbal consent sutures removed. Tolerated procedure well, no complications. Finger wrapped with triple antibiotic ointment and non adherent gauze.        Meds ordered this encounter  Medications   doxycycline (VIBRA-TABS) 100 MG tablet    Sig: Take 1 tablet twice a day for 7 days, then 1 tablet daily    Dispense:  35 tablet    Refill:  0    Return if symptoms worsen or fail to improve.  Gerre Scull, NP

## 2023-01-30 NOTE — Assessment & Plan Note (Signed)
Doxycycline had helped while taking it, however returned when he finished the course. Will start doxycycline 100mg  BID x7 days for laceration, then once daily for 3 more weeks for acne. Follow-up with any concerns.

## 2023-01-30 NOTE — Assessment & Plan Note (Signed)
He cut his finger on 01/21/23 using a wood chipper. He went to ER and had negative x-ray of his finger. He received 5 sutures. He states he is having some yellow drainage from it. No redness, or signs of drainage today. With acne treating with doxycycline will start doxycycline BID x7 days. Keep area covered while at work and wash with soap and water. After obtaining verbal consent sutures removed. Tolerated procedure well, no complications. Finger wrapped with triple antibiotic ointment and non adherent gauze.

## 2023-03-24 DIAGNOSIS — K7581 Nonalcoholic steatohepatitis (NASH): Secondary | ICD-10-CM | POA: Diagnosis not present

## 2023-03-24 DIAGNOSIS — R0789 Other chest pain: Secondary | ICD-10-CM | POA: Diagnosis not present

## 2023-03-24 DIAGNOSIS — S299XXA Unspecified injury of thorax, initial encounter: Secondary | ICD-10-CM | POA: Diagnosis not present

## 2023-03-24 DIAGNOSIS — R1011 Right upper quadrant pain: Secondary | ICD-10-CM | POA: Diagnosis not present

## 2023-03-24 DIAGNOSIS — M545 Low back pain, unspecified: Secondary | ICD-10-CM | POA: Diagnosis not present

## 2023-03-24 DIAGNOSIS — S199XXA Unspecified injury of neck, initial encounter: Secondary | ICD-10-CM | POA: Diagnosis not present

## 2023-03-24 DIAGNOSIS — M546 Pain in thoracic spine: Secondary | ICD-10-CM | POA: Diagnosis not present

## 2023-03-24 DIAGNOSIS — S301XXA Contusion of abdominal wall, initial encounter: Secondary | ICD-10-CM | POA: Diagnosis not present

## 2023-03-24 DIAGNOSIS — R109 Unspecified abdominal pain: Secondary | ICD-10-CM | POA: Diagnosis not present

## 2023-03-24 DIAGNOSIS — E669 Obesity, unspecified: Secondary | ICD-10-CM | POA: Diagnosis not present

## 2023-03-24 DIAGNOSIS — S0990XA Unspecified injury of head, initial encounter: Secondary | ICD-10-CM | POA: Diagnosis not present

## 2023-03-24 LAB — LAB REPORT - SCANNED

## 2023-09-02 ENCOUNTER — Encounter: Payer: Self-pay | Admitting: Nurse Practitioner

## 2023-09-02 ENCOUNTER — Ambulatory Visit (INDEPENDENT_AMBULATORY_CARE_PROVIDER_SITE_OTHER): Admitting: Nurse Practitioner

## 2023-09-02 VITALS — BP 118/78 | HR 80 | Temp 97.2°F | Ht 72.0 in | Wt 276.0 lb

## 2023-09-02 DIAGNOSIS — J301 Allergic rhinitis due to pollen: Secondary | ICD-10-CM | POA: Diagnosis not present

## 2023-09-02 DIAGNOSIS — R040 Epistaxis: Secondary | ICD-10-CM | POA: Insufficient documentation

## 2023-09-02 DIAGNOSIS — K219 Gastro-esophageal reflux disease without esophagitis: Secondary | ICD-10-CM

## 2023-09-02 MED ORDER — FAMOTIDINE 20 MG PO TABS: 20.0000 mg | ORAL_TABLET | Freq: Every day | ORAL | 1 refills | Status: DC

## 2023-09-02 NOTE — Assessment & Plan Note (Signed)
Chronic, stable.  Continue Pepcid 20 mg daily as needed.  Refill sent to the pharmacy.

## 2023-09-02 NOTE — Patient Instructions (Signed)
 It was great to see you!  Start saline nasal spray several times a day   I have placed a referral to ENT and allergist  Let's follow-up if symptoms worsen or any concerns.   Take care,  Rheba Cedar, NP

## 2023-09-02 NOTE — Assessment & Plan Note (Signed)
 Chronic allergic rhinitis presents with left-sided epistaxis, possibly exacerbated by Flonase . Loratadine  causes drowsiness. There is a family history of severe allergies, with symptoms worsening in spring. ENT and allergy referrals are necessary for further evaluation. Start saline nasal spray three times daily, then reduce to twice daily, and eventually as needed. Continue Flonase  after starting saline spray to minimize epistaxis.

## 2023-09-02 NOTE — Progress Notes (Signed)
 Established Patient Office Visit  Subjective   Patient ID: Ronald Phillips, male    DOB: [birth year]  Age: 23 y.o. MRN: [id number]  Chief Complaint  Patient presents with   Allergies    With frequent nose bleeds, Rx refill    HPI Discussed the use of AI scribe software for clinical note transcription with the patient, who gave verbal consent to proceed.  History of Present Illness   Ronald Phillips "Ronald Phillips" is a 23 year old male who presents with recurrent nosebleeds and allergy symptoms.  He experiences recurrent epistaxis, particularly after work, with increased frequency during the spring. Flonase  nasal spray alleviates congestion but not the epistaxis, leading to its discontinuation due to concerns about causing nosebleeds.  Allergy symptoms include rhinorrhea, itchy and watery eyes, and occasional coughing. He experiences shortness of breath and wheezing. Loratadine  causes drowsiness, even in non-drowsy formulations.  Allergy symptoms recur every spring. His father has severe allergies and receives allergy shots.        ROS See pertinent positives and negatives per HPI.    Objective:     BP 118/78 (BP Location: Left Arm, Patient Position: Sitting, Cuff Size: Normal)   Pulse 80   Temp (!) 97.2 F (36.2 C)   Ht 6' (1.829 m)   Wt 276 lb (125.2 kg)   SpO2 98%   BMI 37.43 kg/m    Physical Exam Vitals and nursing note reviewed.  Constitutional:      Appearance: Normal appearance.  HENT:     Head: Normocephalic.     Right Ear: Tympanic membrane, ear canal and external ear normal.     Left Ear: Tympanic membrane, ear canal and external ear normal.     Nose: Rhinorrhea present.     Right Nostril: No epistaxis.     Left Nostril: No epistaxis.     Right Turbinates: Enlarged.     Left Turbinates: Enlarged.     Mouth/Throat:     Mouth: Mucous membranes are moist.     Pharynx: No posterior oropharyngeal erythema.  Eyes:     Conjunctiva/sclera: Conjunctivae normal.   Cardiovascular:     Rate and Rhythm: Normal rate and regular rhythm.     Pulses: Normal pulses.     Heart sounds: Normal heart sounds.  Pulmonary:     Effort: Pulmonary effort is normal.     Breath sounds: Normal breath sounds.  Musculoskeletal:     Cervical back: Normal range of motion and neck supple. No tenderness.  Lymphadenopathy:     Cervical: No cervical adenopathy.  Skin:    General: Skin is warm.  Neurological:     General: No focal deficit present.     Mental Status: He is alert and oriented to person, place, and time.  Psychiatric:        Mood and Affect: Mood normal.        Behavior: Behavior normal.        Thought Content: Thought content normal.        Judgment: Judgment normal.       Assessment & Plan:   Problem List Items Addressed This Visit       Respiratory   Seasonal allergic rhinitis due to pollen - Primary   Chronic allergic rhinitis presents with left-sided epistaxis, possibly exacerbated by Flonase . Loratadine  causes drowsiness. There is a family history of severe allergies, with symptoms worsening in spring. ENT and allergy referrals are necessary for further evaluation. Start saline nasal spray three times daily, then  reduce to twice daily, and eventually as needed. Continue Flonase  after starting saline spray to minimize epistaxis.        Relevant Orders   Ambulatory referral to Allergy     Digestive   Gastroesophageal reflux disease   Chronic, stable.  Continue Pepcid  20 mg daily as needed.  Refill sent to the pharmacy.      Relevant Medications   famotidine  (PEPCID ) 20 MG tablet     Other   Frequent nosebleeds   Chronic allergic rhinitis presents with left-sided epistaxis, possibly exacerbated by Flonase . Loratadine  causes drowsiness. There is a family history of severe allergies, with symptoms worsening in spring. ENT and allergy referrals are necessary for further evaluation. Start saline nasal spray three times daily, then reduce to  twice daily, and eventually as needed. Continue Flonase  after starting saline spray to minimize epistaxis.      Relevant Orders   Ambulatory referral to ENT    Return if symptoms worsen or fail to improve.    Ronald Benjamin, NP

## 2023-09-19 ENCOUNTER — Ambulatory Visit (INDEPENDENT_AMBULATORY_CARE_PROVIDER_SITE_OTHER): Admitting: Physician Assistant

## 2023-09-19 ENCOUNTER — Encounter (INDEPENDENT_AMBULATORY_CARE_PROVIDER_SITE_OTHER): Payer: Self-pay | Admitting: Physician Assistant

## 2023-09-19 VITALS — BP 109/73 | HR 89 | Ht 72.0 in | Wt 275.0 lb

## 2023-09-19 DIAGNOSIS — R040 Epistaxis: Secondary | ICD-10-CM | POA: Diagnosis not present

## 2023-09-19 DIAGNOSIS — J301 Allergic rhinitis due to pollen: Secondary | ICD-10-CM

## 2023-09-19 MED ORDER — LORATADINE 10 MG PO TABS: 10.0000 mg | ORAL_TABLET | Freq: Every day | ORAL | 11 refills | Status: DC

## 2023-09-19 NOTE — Progress Notes (Signed)
 Dear Dr. Ricke Phillips, Here is my assessment for our mutual patient, Ronald Phillips. Thank you for allowing me the opportunity to care for your patient. Please do not hesitate to contact me should you have any other questions. Sincerely, Ronald Boxer PA-C  Otolaryngology Clinic Note Referring provider: Dr. Ricke Phillips HPI:  Ronald Phillips is a 23 y.o. male kindly referred by Dr. Ricke Phillips   The patient is a 23 year old gentleman seen in our office for evaluation of epistaxis.  The patient notes that he has had a longstanding history of epistaxis since he was a kid.  He notes that the symptoms are generally worse during allergy season.  He notes he has uncontrolled seasonal allergies.  He notes when the pollen is out he generally has more congestion and episodes of epistaxis.  He notes that in the winter months the symptoms do improve, he also notes that since it has been raining over the last week he has not had any episodes of epistaxis.  He notes normally he has at least 1 a day.  He notes it appears anterior, and runs out the front and resolve spontaneously.  He denies any trauma to the nose.  He has not tried any medications for this.  He has not used any medications for his seasonal allergies but does note he has an appointment with his allergist.  He notes that the left side seems to bleed more frequently than the right but does have bleeding from both sides.  Denies any significant posterior bleed.  Denies any associated pain, no neurologic deficits, he denies any issues with his ears.  Denies any history of bleeding disorders.     Independent Review of Additional Tests or Records:  None   PMH/Meds/All/SocHx/FamHx/ROS:   Past Medical History:  Diagnosis Date   Allergic rhinitis 09/03/2012   Constipation 09/03/2012   Rx Miralax .  Instructions re: eat more fiber.     Eczema    Fatty liver    GERD (gastroesophageal reflux disease)    Hardening of the liver    Overweight(278.02) 09/03/2012      Past Surgical History:  Procedure Laterality Date   LIVER BIOPSY      Family History  Problem Relation Age of Onset   Healthy Mother    Healthy Father      Social Connections: Not on file      Current Outpatient Medications:    famotidine  (PEPCID ) 20 MG tablet, Take 1 tablet (20 mg total) by mouth daily., Disp: 90 tablet, Rfl: 1   fluticasone  (FLONASE ) 50 MCG/ACT nasal spray, Place 2 sprays into both nostrils daily., Disp: 16 g, Rfl: 6   triamcinolone  cream (KENALOG ) 0.1 %, Apply 1 Application topically 2 (two) times daily. (Patient not taking: Reported on 09/19/2023), Disp: 30 g, Rfl: 0   Physical Exam:   BP 109/73   Pulse 89   Ht 6' (1.829 m)   Wt 275 lb (124.7 kg)   SpO2 97%   BMI 37.30 kg/m   Pertinent Findings  CN II-XII intact Bilateral EAC clear and TM intact with well pneumatized middle ear spaces Anterior rhinoscopy: Septum midline; right inferior turbinate with severe hypertrophy and edema, left inferior turbinate with moderate hypertrophy and edema, no polyps, there is an area of irritation along the right nare as noted in the photo below.  Superficial vessels noted along the left anterior septum No lesions of oral cavity/oropharynx; dentition in normal limits No obviously palpable neck masses/lymphadenopathy/thyromegaly No respiratory distress or stridor  Seprately Identifiable  Procedures:  Procedure Note Pre-procedure diagnosis:  epistaxis Post-procedure diagnosis: Same Procedure: Transnasal Fiberoptic Laryngoscopy, CPT 31575 - Mod 25 Indication: see above Complications: None apparent EBL: 0 mL   The procedure was undertaken to further evaluate the patient's complaint of epistaxis, with mirror exam inadequate for appropriate examination due to gag reflex and poor patient tolerance   Procedure:  Patient was identified as correct patient. Verbal consent was obtained. The nose was sprayed with oxymetazoline and 4% lidocaine . The The flexible laryngoscope  was passed through the nose to view the nasal cavity, pharynx (oropharynx, hypopharynx) and larynx.  The larynx was examined at rest and during multiple phonatory tasks. Documentation was obtained and reviewed with patient. The scope was removed. The patient tolerated the procedure well.   Findings: The nasal cavity and nasopharynx did not reveal any masses or lesions, mucosa appeared to be without obvious lesions. The tongue base, pharyngeal walls, piriform sinuses. Small area of irritation along right inferior portion of the right naris.  Left  Right   Impression & Plans:  Ronald Phillips is a 23 y.o. male with the following   Epistaxis-  This is a 23 year old male seen in office for epistaxis.  His symptoms are highly correlated to his untreated seasonal allergies.  He notes that when the allergies are prominent he has more nosebleeds, notably over the last week it has been raining a lot and he has had no nosebleeds.  He does have an rotation in the right anterior nose, I did not cauterize it today.  He also has some superficial vessels on the left anterior septum.  No concerning lesions within the nasopharynx or nasal cavity.  I discussed treatment options with the patient, at this point we would like to attempt symptomatic control.  I have recommended daily antihistamine, nasal saline irrigation and nasal sprays.  I have also recommended he use Vaseline.  He avoid any digital trauma.  He will also use a humidifier.  I would like him to try these for approximately 1 month, I would like to see him in the office after that point to see if he has had any significant reoccurring nosebleeds.  I did not start him on Flonase  as this can be somewhat drying and would like to try these other treatment options before maximizing medical therapy for his allergic rhinitis.  He also has a visit with his allergist at the end of June.  The patient is happy with today's plan, he verbalized understanding and agreement  to today's plan.   - f/u 1 month in our office    Thank you for allowing me the opportunity to care for your patient. Please do not hesitate to contact me should you have any other questions.  Sincerely, Ronald Boxer PA-C [facility] Phone: [phone] Fax: [phone]  09/19/2023, 3:02 PM

## 2023-10-18 ENCOUNTER — Ambulatory Visit (INDEPENDENT_AMBULATORY_CARE_PROVIDER_SITE_OTHER): Admitting: Allergy

## 2023-10-18 ENCOUNTER — Encounter: Payer: Self-pay | Admitting: Allergy

## 2023-10-18 ENCOUNTER — Other Ambulatory Visit: Payer: Self-pay

## 2023-10-18 VITALS — BP 124/72 | HR 98 | Temp 98.4°F | Ht 72.0 in | Wt 258.2 lb

## 2023-10-18 DIAGNOSIS — R040 Epistaxis: Secondary | ICD-10-CM | POA: Diagnosis not present

## 2023-10-18 DIAGNOSIS — Z888 Allergy status to other drugs, medicaments and biological substances status: Secondary | ICD-10-CM | POA: Diagnosis not present

## 2023-10-18 DIAGNOSIS — H109 Unspecified conjunctivitis: Secondary | ICD-10-CM

## 2023-10-18 DIAGNOSIS — H1013 Acute atopic conjunctivitis, bilateral: Secondary | ICD-10-CM | POA: Diagnosis not present

## 2023-10-18 DIAGNOSIS — T50905D Adverse effect of unspecified drugs, medicaments and biological substances, subsequent encounter: Secondary | ICD-10-CM

## 2023-10-18 DIAGNOSIS — J310 Chronic rhinitis: Secondary | ICD-10-CM | POA: Diagnosis not present

## 2023-10-18 MED ORDER — LEVOCETIRIZINE DIHYDROCHLORIDE 5 MG PO TABS: 5.0000 mg | ORAL_TABLET | Freq: Every evening | ORAL | 5 refills | Status: DC

## 2023-10-18 MED ORDER — CROMOLYN SODIUM 4 % OP SOLN: 2.0000 [drp] | Freq: Four times a day (QID) | OPHTHALMIC | 5 refills | Status: DC | PRN

## 2023-10-18 NOTE — Patient Instructions (Addendum)
 Rhinoconjunctivitis Chronic allergic rhinitis with seasonal exacerbations. Claritin  ineffective, Zyrtec  causes drowsiness. Saline spray provides partial relief. No sinus infections.  - Prescribed Xyzal (levocetirizine) 5 mg daily as needed.   - Prescribed allergy eye drops Cromolyn 1 to 2 drops up to 4 times a day each eye as needed for itchy watery eyes - Schedule allergy skin testing; advised to hold antihistamines for three days prior. - Will provide avoidance measure handouts post-testing. - Discussed potential allergy shots if medications fail.  Epistaxis Recurrent epistaxis possibly worsened by steroid nasal sprays.  - Continue saline nasal spray for moisture. - Lean forward and pinch the nose for several minutes until the nosebleed stops.  If it does not stop quickly would recommend having the cauterization done with ENT.  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 Low likelihood of current allergy due to time elapsed. - Recommend graded oral challenge to Omnicef.  Schedule skin testing visit and avoid antihistamines for 3 days prior. Routine follow-up in 4 to 6 months or sooner if needed

## 2023-10-18 NOTE — Progress Notes (Addendum)
 New Patient Note  RE: Ronald Phillips MRN: [id number] DOB: 11-03-2000 Date of Office Visit: 10/18/2023  Primary care provider: Nedra Tinnie LABOR, NP  Chief Complaint: Allergies  History of present illness: Ronald Phillips is a 23 y.o. male presenting today for evaluation of seasonal allergic rhinitis due to pollen.   Discussed the use of AI scribe software for clinical note transcription with the patient, who gave verbal consent to proceed.  He experiences severe allergy symptoms during spring, summer, and fall, particularly when pollen levels are high. Symptoms include puffy, watery eyes, constant epistaxis, itchy and clogged throat, and frequent sneezing. These symptoms worsen with activities such as cutting grass.  He has tried various treatments for his allergies. A saline nasal spray is currently used to keep his nasal passages moisturized, which he finds helpful. Claritin  does not alleviate his symptoms effectively and Zyrtec  causes drowsiness.   He has not experienced sinus infections requiring antibiotics. He has not used eye drops for his symptoms but is open to trying them. He has not performed a full saline rinse or used a neti pot, expressing discomfort with the idea.  He has a history of epistaxis and has used nasal sprays in the past, namely Flonase . A ENT evaluated his nasal passages and discussed treatment options with him including cauterization of.  He has no history of asthma or food allergies. He mentions a family history of asthma, specifically his brother. He recalls a possible history of eczema during childhood but has not experienced symptoms as an adult.  He was told by his mother that he developed a rash from Omnicef as a infant/toddler on his thigh as he said it was given as an injection, leading to its avoidance. He is considering reevaluating this allergy to expand his treatment options.      Review of systems: 10pt ROS negative unless noted in HPI  Past  medical history: Past Medical History:  Diagnosis Date   Allergic rhinitis 09/03/2012   Constipation 09/03/2012   Rx Miralax .  Instructions re: eat more fiber.     Eczema    Fatty liver    GERD (gastroesophageal reflux disease)    Hardening of the liver    Overweight(278.02) 09/03/2012    Past surgical history: Past Surgical History:  Procedure Laterality Date   LIVER BIOPSY      Family history:  Family History  Problem Relation Age of Onset   Healthy Mother    Healthy Father    Asthma Brother    Allergic rhinitis Paternal Aunt    Allergic rhinitis Paternal Uncle    Allergic rhinitis Paternal Grandmother    Allergic rhinitis Paternal Grandfather     Social history: Lives in an home with carpeting with gas heating and central cooling.  Cat in the home.  No concern for water damage, mildew or roaches in the home.  He is a International aid/development worker.  He does smoke cigarettes.  Medication List: Current Outpatient Medications  Medication Sig Dispense Refill   cromolyn (OPTICROM) 4 % ophthalmic solution Place 2 drops into both eyes 4 (four) times daily as needed (itchy/watery eyes). 10 mL 5   famotidine  (PEPCID ) 20 MG tablet Take 1 tablet (20 mg total) by mouth daily. 90 tablet 1   fluticasone  (FLONASE ) 50 MCG/ACT nasal spray Place 2 sprays into both nostrils daily. 16 g 6   levocetirizine (XYZAL) 5 MG tablet Take 1 tablet (5 mg total) by mouth every evening. 30 tablet 5   triamcinolone  cream (  KENALOG ) 0.1 % Apply 1 Application topically 2 (two) times daily. (Patient not taking: Reported on 10/18/2023) 30 g 0   No current facility-administered medications for this visit.    Known medication allergies: Allergies  Allergen Reactions   Omnicef [Cefdinir] Rash    Per mom patient has tolerated penicillin  in the past     Physical examination: Blood pressure 124/72, pulse 98, temperature 98.4 F (36.9 C), temperature source Temporal, height 6' (1.829 m), weight 258 lb 3.2  oz (117.1 kg), SpO2 97%.  General: Alert, interactive, in no acute distress. HEENT: PERRLA, TMs pearly gray, turbinates mildly edematous without discharge, post-pharynx non erythematous. Neck: Supple without lymphadenopathy. Lungs: Clear to auscultation without wheezing, rhonchi or rales. {no increased work of breathing. CV: Normal S1, S2 without murmurs. Abdomen: Nondistended, nontender. Skin: Warm and dry, without lesions or rashes. Extremities:  No clubbing, cyanosis or edema. Neuro:   Grossly intact.  Diagnostics/Labs: None today  Assessment and plan: Rhinoconjunctivitis Chronic allergic rhinitis with seasonal exacerbations. Claritin  ineffective, Zyrtec  causes drowsiness. Saline spray provides partial relief. No sinus infections.  - Prescribed Xyzal (levocetirizine) 5 mg daily as needed.   - Prescribed allergy eye drops Cromolyn 1 to 2 drops up to 4 times a day each eye as needed for itchy watery eyes - Schedule allergy skin testing; advised to hold antihistamines for three days prior. - Will provide avoidance measure handouts post-testing. - Discussed potential allergy shots if medications fail.  Epistaxis Recurrent epistaxis possibly worsened by steroid nasal sprays.  - Continue saline nasal spray for moisture. - Lean forward and pinch the nose for several minutes until the nosebleed stops.  If it does not stop quickly would recommend having the cauterization done with ENT.  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 Low likelihood of current allergy due to time elapsed. - Recommend graded oral challenge to Omnicef.  Schedule skin testing visit and avoid antihistamines for 3 days prior.  (Env 1-55) Routine follow-up in 4 to 6 months or sooner if needed  I appreciate the opportunity to take part in Ronald Phillips's care. Please do not hesitate to contact me with questions.  Sincerely,   Danita Brain, MD Allergy/Immunology [facility]

## 2023-10-24 ENCOUNTER — Ambulatory Visit (INDEPENDENT_AMBULATORY_CARE_PROVIDER_SITE_OTHER): Admitting: Physician Assistant

## 2023-10-24 ENCOUNTER — Encounter (INDEPENDENT_AMBULATORY_CARE_PROVIDER_SITE_OTHER): Payer: Self-pay

## 2023-11-01 ENCOUNTER — Ambulatory Visit (INDEPENDENT_AMBULATORY_CARE_PROVIDER_SITE_OTHER): Admitting: Allergy

## 2023-11-01 ENCOUNTER — Encounter: Payer: Self-pay | Admitting: Allergy

## 2023-11-01 DIAGNOSIS — J302 Other seasonal allergic rhinitis: Secondary | ICD-10-CM | POA: Diagnosis not present

## 2023-11-01 DIAGNOSIS — H1013 Acute atopic conjunctivitis, bilateral: Secondary | ICD-10-CM | POA: Diagnosis not present

## 2023-11-01 DIAGNOSIS — H109 Unspecified conjunctivitis: Secondary | ICD-10-CM

## 2023-11-01 DIAGNOSIS — J3089 Other allergic rhinitis: Secondary | ICD-10-CM

## 2023-11-01 NOTE — Patient Instructions (Addendum)
 Rhinoconjunctivitis, allergic Chronic allergic rhinitis with seasonal exacerbations. Claritin  ineffective, Zyrtec  causes drowsiness. Saline spray provides partial relief. No sinus infections.  - Prescribed Xyzal  (levocetirizine) 5 mg daily as needed.   - Prescribed allergy  eye drops Cromolyn  1 to 2 drops up to 4 times a day each eye as needed for itchy watery eyes - Testing today showed: grasses, ragweed, weeds, trees, outdoor molds, dust mites, cat, dog, and cockroach - Copy of test results provided.  - Avoidance measures provided. - Consider allergy  shots as a means of long-term control. - Allergy  shots re-train and reset the immune system to ignore environmental allergens and decrease the resulting immune response to those allergens (sneezing, itchy watery eyes, runny nose, nasal congestion, etc).    - Allergy  shots improve symptoms in 75-85% of patients.  - We can discuss more at the next appointment if the medications are not working for you.  Epistaxis Recurrent epistaxis possibly worsened by steroid nasal sprays.  - Continue saline nasal spray for moisture. - Lean forward and pinch the nose for several minutes until the nosebleed stops.  If it does not stop quickly would recommend having the cauterization done with ENT.  Omnicef allergy  Reported childhood rash with Omnicef. Low likelihood of current allergy  due to time elapsed. - Recommend graded oral challenge to Omnicef.   Routine follow-up in 4 to 6 months or sooner if needed

## 2023-11-01 NOTE — Progress Notes (Signed)
    Follow-up Note  RE: Ronald Phillips MRN: [id number] DOB: [birth year] Date of Office Visit: 11/01/2023   History of present illness: Ronald Phillips is a 23 y.o. male presenting today for skin testing visit.  He was last seen in the office on 10/18/23 for rhinoconjunctivitis.  He is in his usual state of health today without recent illness.  He has held antihistamines for at least 3 days for testing today.   Medication List: Current Outpatient Medications  Medication Sig Dispense Refill   cromolyn  (OPTICROM ) 4 % ophthalmic solution Place 2 drops into both eyes 4 (four) times daily as needed (itchy/watery eyes). 10 mL 5   famotidine  (PEPCID ) 20 MG tablet Take 1 tablet (20 mg total) by mouth daily. 90 tablet 1   fluticasone  (FLONASE ) 50 MCG/ACT nasal spray Place 2 sprays into both nostrils daily. 16 g 6   levocetirizine (XYZAL ) 5 MG tablet Take 1 tablet (5 mg total) by mouth every evening. 30 tablet 5   triamcinolone  cream (KENALOG ) 0.1 % Apply 1 Application topically 2 (two) times daily. (Patient not taking: Reported on 10/18/2023) 30 g 0   No current facility-administered medications for this visit.     Known medication allergies: Allergies  Allergen Reactions   Omnicef [Cefdinir] Rash    Per mom patient has tolerated penicillin  in the past    Diagnostics/Labs:  Allergy  testing: Positive for grasses, ragweed, weeds, trees, outdoor molds, dust mites, cat, dog, and cockroach.  See flow sheet in scanned testing s sheet in EMR  Allergy  testing results were read and interpreted by provider, documented by clinical staff.   Assessment and plan: Rhinoconjunctivitis, allergic Chronic allergic rhinitis with seasonal exacerbations. Claritin  ineffective, Zyrtec  causes drowsiness. Saline spray provides partial relief. No sinus infections.  - Prescribed Xyzal  (levocetirizine) 5 mg daily as needed.   - Prescribed allergy  eye drops Cromolyn  1 to 2 drops up to 4 times a day each eye as needed for  itchy watery eyes - Testing today showed: grasses, ragweed, weeds, trees, outdoor molds, dust mites, cat, dog, and cockroach - Copy of test results provided.  - Avoidance measures provided. - Consider allergy  shots as a means of long-term control. - Allergy  shots re-train and reset the immune system to ignore environmental allergens and decrease the resulting immune response to those allergens (sneezing, itchy watery eyes, runny nose, nasal congestion, etc).    - Allergy  shots improve symptoms in 75-85% of patients.  - We can discuss more at the next appointment if the medications are not working for you.  Epistaxis Recurrent epistaxis possibly worsened by steroid nasal sprays.  - Continue saline nasal spray for moisture. - Lean forward and pinch the nose for several minutes until the nosebleed stops.  If it does not stop quickly would recommend having the cauterization done with ENT.  Omnicef allergy  Reported childhood rash with Omnicef. Low likelihood of current allergy  due to time elapsed. - Recommend graded oral challenge to Omnicef.   Routine follow-up in 4 to 6 months or sooner if needed   I appreciate the opportunity to take part in [name]'s care. Please do not hesitate to contact me with questions.  Sincerely,   Danita Brain, MD Allergy /Immunology [facility]

## 2023-12-03 ENCOUNTER — Encounter: Payer: Self-pay | Admitting: Nurse Practitioner

## 2023-12-03 ENCOUNTER — Encounter: Payer: Medicaid Other | Admitting: Nurse Practitioner

## 2023-12-09 ENCOUNTER — Encounter: Admitting: Nurse Practitioner

## 2023-12-09 ENCOUNTER — Telehealth: Payer: Self-pay | Admitting: Nurse Practitioner

## 2023-12-09 NOTE — Telephone Encounter (Signed)
 12/03/2023 no show 12/09/2023 no show  Final warning sent via mail and mychart

## 2023-12-09 NOTE — Telephone Encounter (Signed)
 Noted

## 2023-12-17 ENCOUNTER — Encounter: Admitting: Nurse Practitioner

## 2023-12-18 ENCOUNTER — Ambulatory Visit: Admitting: Nurse Practitioner

## 2023-12-18 ENCOUNTER — Encounter: Payer: Self-pay | Admitting: Nurse Practitioner

## 2023-12-18 VITALS — BP 118/66 | HR 88 | Temp 96.9°F | Ht 72.0 in | Wt 279.8 lb

## 2023-12-18 DIAGNOSIS — Z0000 Encounter for general adult medical examination without abnormal findings: Secondary | ICD-10-CM

## 2023-12-18 DIAGNOSIS — M545 Low back pain, unspecified: Secondary | ICD-10-CM | POA: Diagnosis not present

## 2023-12-18 DIAGNOSIS — Z6837 Body mass index (BMI) 37.0-37.9, adult: Secondary | ICD-10-CM | POA: Diagnosis not present

## 2023-12-18 DIAGNOSIS — R21 Rash and other nonspecific skin eruption: Secondary | ICD-10-CM

## 2023-12-18 DIAGNOSIS — E669 Obesity, unspecified: Secondary | ICD-10-CM

## 2023-12-18 DIAGNOSIS — R7303 Prediabetes: Secondary | ICD-10-CM | POA: Diagnosis not present

## 2023-12-18 DIAGNOSIS — G8929 Other chronic pain: Secondary | ICD-10-CM

## 2023-12-18 DIAGNOSIS — K219 Gastro-esophageal reflux disease without esophagitis: Secondary | ICD-10-CM | POA: Diagnosis not present

## 2023-12-18 DIAGNOSIS — K760 Fatty (change of) liver, not elsewhere classified: Secondary | ICD-10-CM | POA: Diagnosis not present

## 2023-12-18 LAB — HEMOGLOBIN A1C: Hgb A1c MFr Bld: 5.7 % (ref 4.6–6.5)

## 2023-12-18 LAB — CBC WITH DIFFERENTIAL/PLATELET
Basophils Absolute: 0 K/uL (ref 0.0–0.1)
Basophils Relative: 0.6 % (ref 0.0–3.0)
Eosinophils Absolute: 0.1 K/uL (ref 0.0–0.7)
Eosinophils Relative: 2 % (ref 0.0–5.0)
HCT: 43.4 % (ref 39.0–52.0)
Hemoglobin: 14.8 g/dL (ref 13.0–17.0)
Lymphocytes Relative: 28.9 % (ref 12.0–46.0)
Lymphs Abs: 2 K/uL (ref 0.7–4.0)
MCHC: 34.2 g/dL (ref 30.0–36.0)
MCV: 89.2 fl (ref 78.0–100.0)
Monocytes Absolute: 0.4 K/uL (ref 0.1–1.0)
Monocytes Relative: 6 % (ref 3.0–12.0)
Neutro Abs: 4.3 K/uL (ref 1.4–7.7)
Neutrophils Relative %: 62.5 % (ref 43.0–77.0)
Platelets: 282 K/uL (ref 150.0–400.0)
RBC: 4.87 Mil/uL (ref 4.22–5.81)
RDW: 12.9 % (ref 11.5–15.5)
WBC: 6.9 K/uL (ref 4.0–10.5)

## 2023-12-18 LAB — COMPREHENSIVE METABOLIC PANEL WITH GFR
ALT: 219 U/L — ABNORMAL HIGH (ref 0–53)
AST: 115 U/L — ABNORMAL HIGH (ref 0–37)
Albumin: 4.6 g/dL (ref 3.5–5.2)
Alkaline Phosphatase: 73 U/L (ref 39–117)
BUN: 13 mg/dL (ref 6–23)
CO2: 27 meq/L (ref 19–32)
Calcium: 9.6 mg/dL (ref 8.4–10.5)
Chloride: 104 meq/L (ref 96–112)
Creatinine, Ser: 0.79 mg/dL (ref 0.40–1.50)
GFR: 127.18 mL/min (ref >60.00)
Glucose, Bld: 116 mg/dL — ABNORMAL HIGH (ref 70–99)
Potassium: 3.9 meq/L (ref 3.5–5.1)
Sodium: 141 meq/L (ref 135–145)
Total Bilirubin: 0.8 mg/dL (ref 0.2–1.2)
Total Protein: 7.3 g/dL (ref 6.0–8.3)

## 2023-12-18 MED ORDER — TRIAMCINOLONE ACETONIDE 0.1 % EX CREA: 1.0000 | TOPICAL_CREAM | Freq: Two times a day (BID) | CUTANEOUS | 0 refills | Status: DC

## 2023-12-18 MED ORDER — FAMOTIDINE 20 MG PO TABS: 20.0000 mg | ORAL_TABLET | Freq: Every day | ORAL | 1 refills | Status: AC

## 2023-12-18 NOTE — Patient Instructions (Signed)
 It was great to see you!  I have ordered an ultrasound of your liver  We are checking your labs today and will let you know the results via mychart/phone.   Let's follow-up in 1 year, sooner if you have concerns.  If a referral was placed today, you will be contacted for an appointment. Please note that routine referrals can sometimes take up to 3-4 weeks to process. Please call our office if you haven't heard anything after this time frame.  Take care,  Tinnie Harada, NP

## 2023-12-18 NOTE — Progress Notes (Unsigned)
 BP 118/66 (BP Location: Left Arm, Patient Position: Sitting, Cuff Size: Large)   Pulse 88   Temp (!) 96.9 F (36.1 C)   Ht 6' (1.829 m)   Wt 279 lb 12.8 oz (126.9 kg)   SpO2 97%   BMI 37.95 kg/m    Subjective:    Patient ID: Ronald Phillips, male    DOB: [birth year], 23 y.o.   MRN: [id number]  CC: Chief Complaint  Patient presents with   Annual Exam    With labs-patient is not fasting, Rx refill, concerns with bumps at base of scalp    HPI: Ronald Phillips is a 23 y.o. male presenting on 12/18/2023 for comprehensive medical examination. Current medical complaints include:{Blank single:19197::none,***}  He currently lives with:  Depression and Anxiety Screen done today and results listed below:     12/18/2023   10:42 AM 01/30/2023    1:21 PM 11/26/2022   10:52 AM 08/21/2022   10:21 AM 03/28/2018   11:34 AM  Depression screen PHQ 2/9  Decreased Interest 0 0 1 0 0  Down, Depressed, Hopeless 0 0 0 0 0  PHQ - 2 Score 0 0 1 0 0  Altered sleeping 2 0 0 1 2  Tired, decreased energy 2 0 1 0 2  Change in appetite 2 0 1 1 1   Feeling bad or failure about yourself  0 0 0 0 0  Trouble concentrating 1 1 3 1 1   Moving slowly or fidgety/restless 0 0 1 0 0  Suicidal thoughts 0 0 0 0   PHQ-9 Score 7 1 7 3 6   Difficult doing work/chores Somewhat difficult Not difficult at all Somewhat difficult        12/18/2023   10:43 AM 01/30/2023    1:22 PM 11/26/2022   10:52 AM 08/21/2022   10:23 AM  GAD 7 : Generalized Anxiety Score  Nervous, Anxious, on Edge 0 0 2 1  Control/stop worrying 0 0 0 0  Worry too much - different things 0 0 0 0  Trouble relaxing 0 0 2 0  Restless 0 1 0 0  Easily annoyed or irritable 0 1 1 1   Afraid - awful might happen 0 0 0 0  Total GAD 7 Score 0 2 5 2   Anxiety Difficulty Not difficult at all Not difficult at all Somewhat difficult     The patient {has/does not have:19849} a history of falls. I {did/did not:19850} complete a risk assessment for falls. A plan of  care for falls {was/was not:19852} documented.   Past Medical History:  Past Medical History:  Diagnosis Date   Allergic rhinitis 09/03/2012   Constipation 09/03/2012   Rx Miralax .  Instructions re: eat more fiber.     Eczema    Fatty liver    GERD (gastroesophageal reflux disease)    Hardening of the liver    Overweight(278.02) 09/03/2012    Surgical History:  Past Surgical History:  Procedure Laterality Date   LIVER BIOPSY      Medications:  Current Outpatient Medications on File Prior to Visit  Medication Sig   famotidine  (PEPCID ) 20 MG tablet Take 1 tablet (20 mg total) by mouth daily.   cromolyn  (OPTICROM ) 4 % ophthalmic solution Place 2 drops into both eyes 4 (four) times daily as needed (itchy/watery eyes). (Patient not taking: Reported on 12/18/2023)   fluticasone  (FLONASE ) 50 MCG/ACT nasal spray Place 2 sprays into both nostrils daily. (Patient not taking: Reported on 12/18/2023)   levocetirizine (XYZAL ) 5  MG tablet Take 1 tablet (5 mg total) by mouth every evening. (Patient not taking: Reported on 12/18/2023)   triamcinolone  cream (KENALOG ) 0.1 % Apply 1 Application topically 2 (two) times daily. (Patient not taking: Reported on 12/18/2023)   No current facility-administered medications on file prior to visit.    Allergies:  Allergies  Allergen Reactions   Omnicef [Cefdinir] Rash    Per mom patient has tolerated penicillin  in the past    Social History:  Social History   Socioeconomic History   Marital status: Single    Spouse name: Not on file   Number of children: Not on file   Years of education: Not on file   Highest education level: Not on file  Occupational History   Not on file  Tobacco Use   Smoking status: Every Day    Current packs/day: 0.50    Types: Cigarettes    Passive exposure: Yes   Smokeless tobacco: Current   Tobacco comments:    Dad smokes outside,Patient vapes daily  Vaping Use   Vaping status: Every Day   Substances: Nicotine   Substance and Sexual Activity   Alcohol use: Yes    Comment: twice a month   Drug use: Not Currently   Sexual activity: Yes    Birth control/protection: Condom  Other Topics Concern   Not on file  Social History Narrative   Not on file   Social Drivers of Health   Financial Resource Strain: Not on file  Food Insecurity: No Food Insecurity (07/28/2020)   Received from [facility] visits prior to 06/23/2022.   Hunger Vital Sign    Within the past 12 months, you worried that your food would run out before you got the money to buy more.: Never true    Within the past 12 months, the food you bought just didn't last and you didn't have money to get more.: Never true  Transportation Needs: Not on file  Physical Activity: Not on file  Stress: Not on file  Social Connections: Not on file  Intimate Partner Violence: Not on file   Social History   Tobacco Use  Smoking Status Every Day   Current packs/day: 0.50   Types: Cigarettes   Passive exposure: Yes  Smokeless Tobacco Current  Tobacco Comments   Dad smokes outside,Patient vapes daily   Social History   Substance and Sexual Activity  Alcohol Use Yes   Comment: twice a month    Family History:  Family History  Problem Relation Age of Onset   Healthy Mother    Healthy Father    Asthma Brother    Allergic rhinitis Paternal Aunt    Allergic rhinitis Paternal Uncle    Allergic rhinitis Paternal Grandmother    Allergic rhinitis Paternal Grandfather     Past medical history, surgical history, medications, allergies, family history and social history reviewed with patient today and changes made to appropriate areas of the chart.   ROS All other ROS negative except what is listed above and in the HPI.      Objective:    BP 118/66 (BP Location: Left Arm, Patient Position: Sitting, Cuff Size: Large)   Pulse 88   Temp (!) 96.9 F (36.1 C)   Ht 6' (1.829 m)   Wt 279 lb 12.8 oz (126.9 kg)   SpO2 97%    BMI 37.95 kg/m   Wt Readings from Last 3 Encounters:  12/18/23 279 lb 12.8 oz (126.9 kg)  10/18/23  258 lb 3.2 oz (117.1 kg)  09/19/23 275 lb (124.7 kg)    Physical Exam  Results for orders placed or performed in visit on 03/29/23  Lab report - scanned   Collection Time: 03/24/23  7:53 AM  Result Value Ref Range   EGFR        Assessment & Plan:   Problem List Items Addressed This Visit   None    LABORATORY TESTING:  Health maintenance labs ordered today as discussed above.   The natural history of prostate cancer and ongoing controversy regarding screening and potential treatment outcomes of prostate cancer has been discussed with the patient. The meaning of a false positive PSA and a false negative PSA has been discussed. He indicates understanding of the limitations of this screening test and wishes *** to proceed with screening PSA testing.   IMMUNIZATIONS:   - Tdap: Tetanus vaccination status reviewed: {tetanus status:315746}. - Influenza: {Blank single:19197::Up to date,Administered today,Postponed to flu season,Declined,Given elsewhere} - Pneumovax: {Blank single:19197::Up to date,Administered today,Not applicable,Declined,Given elsewhere} - Prevnar: {Blank single:19197::Up to date,Administered today,Not applicable,Declined,Given elsewhere} - HPV: {Blank single:19197::Up to date,Administered today,Not applicable,Declined,Given elsewhere} - Shingrix vaccine: {Blank single:19197::Up to date,Administered today,Not applicable,Declined,Given elsewhere}  SCREENING: - Colonoscopy: {Blank single:19197::Up to date,Ordered today,Not applicable,Declined,Done elsewhere}  Discussed with patient purpose of the colonoscopy is to detect colon cancer at curable precancerous or early stages   - AAA Screening: {Blank single:19197::Up to date,Ordered today,Not applicable,Declined, Done elsewhere}   PATIENT COUNSELING:     Sexuality: Discussed sexually transmitted diseases, partner selection, use of condoms, avoidance of unintended pregnancy  and contraceptive alternatives.   Advised to avoid cigarette smoking.  I discussed with the patient that most people either abstain from alcohol or drink within safe limits (<=14/week and <=4 drinks/occasion for males, <=7/weeks and <= 3 drinks/occasion for females) and that the risk for alcohol disorders and other health effects rises proportionally with the number of drinks per week and how often a drinker exceeds daily limits.  Discussed cessation/primary prevention of drug use and availability of treatment for abuse.   Diet: Encouraged to adjust caloric intake to maintain  or achieve ideal body weight, to reduce intake of dietary saturated fat and total fat, to limit sodium intake by avoiding high sodium foods and not adding table salt, and to maintain adequate dietary potassium and calcium preferably from fresh fruits, vegetables, and low-fat dairy products.    stressed the importance of regular exercise  Injury prevention: Discussed safety belts, safety helmets, smoke detector, smoking near bedding or upholstery.   Dental health: Discussed importance of regular tooth brushing, flossing, and dental visits.   Follow up plan: NEXT PREVENTATIVE PHYSICAL DUE IN 1 YEAR. No follow-ups on file.  Ronald Phillips

## 2023-12-19 ENCOUNTER — Ambulatory Visit: Payer: Self-pay | Admitting: Nurse Practitioner

## 2023-12-19 DIAGNOSIS — K760 Fatty (change of) liver, not elsewhere classified: Secondary | ICD-10-CM

## 2023-12-20 NOTE — Assessment & Plan Note (Signed)
 Chronic, stable. Check A1c and treat based on results.

## 2023-12-20 NOTE — Assessment & Plan Note (Signed)
 Health maintenance reviewed and updated. Discussed nutrition, exercise. Follow-up 1 year.

## 2023-12-20 NOTE — Assessment & Plan Note (Signed)
Chronic, stable.  Continue Pepcid 20 mg daily as needed.  Refill sent to the pharmacy.

## 2023-12-20 NOTE — Assessment & Plan Note (Addendum)
 Chronic, stable.  His last FibroScan showed a fibrosis score of F0-F1 on 06/13/2021.  Will repeat this.  Encouraged weight loss. Check CMP, CBC today.

## 2023-12-20 NOTE — Assessment & Plan Note (Signed)
 Chronic, stable.  He has been having lower back pain that will flareup intermittently and states that this has happened 3 times over the last year.  Continue regular stretching.  He can take naproxen 500 mg twice a day when this flares up. Follow-up with any concerns.

## 2023-12-20 NOTE — Assessment & Plan Note (Signed)
 BMI 37.9.  Discussed nutrition and exercise.

## 2023-12-26 ENCOUNTER — Ambulatory Visit
Admission: RE | Admit: 2023-12-26 | Discharge: 2023-12-26 | Disposition: A | Discharge status: Home / Self Care | Source: Ambulatory Visit | Attending: Nurse Practitioner | Admitting: Nurse Practitioner

## 2023-12-26 DIAGNOSIS — R7989 Other specified abnormal findings of blood chemistry: Secondary | ICD-10-CM | POA: Diagnosis not present

## 2023-12-26 DIAGNOSIS — K760 Fatty (change of) liver, not elsewhere classified: Secondary | ICD-10-CM

## 2024-01-04 ENCOUNTER — Encounter: Payer: Self-pay | Admitting: Nurse Practitioner

## 2024-01-04 ENCOUNTER — Encounter: Payer: Self-pay | Admitting: Allergy

## 2024-01-06 MED ORDER — CROMOLYN SODIUM 4 % OP SOLN: OPHTHALMIC | 4 refills | Status: AC

## 2024-01-06 MED ORDER — LEVOCETIRIZINE DIHYDROCHLORIDE 5 MG PO TABS: 5.0000 mg | ORAL_TABLET | Freq: Every day | ORAL | 4 refills | Status: AC | PRN

## 2024-01-06 MED ORDER — TRIAMCINOLONE ACETONIDE 0.1 % EX CREA: 1.0000 | TOPICAL_CREAM | Freq: Two times a day (BID) | CUTANEOUS | 0 refills | Status: AC

## 2024-01-06 NOTE — Telephone Encounter (Signed)
 Requesting: triamcinolone  cream (KENALOG ) 0.1 %  Last Visit: 12/18/2023 Next Visit: Visit date not found Last Refill: 12/18/2023  Please Advise

## 2024-02-28 ENCOUNTER — Encounter: Payer: Self-pay | Admitting: Nurse Practitioner

## 2024-04-02 ENCOUNTER — Ambulatory Visit: Admitting: Allergy

## 2024-05-14 ENCOUNTER — Ambulatory Visit: Admitting: Nurse Practitioner

## 2024-05-14 NOTE — Progress Notes (Unsigned)
 "    05/14/2024 Ronald Phillips [id number] [birth year]  PRIMARY GASTROENTEROLOGIST: Dr. Albertus   CHIEF COMPLAINT: Fatty liver   HISTORY OF PRESENT ILLNESS: Ronald Phillips is a 24 year old male with a past medical history of eczema, constipation, GERD and fatty liver.    Discussed the use of AI scribe software for clinical note transcription with the patient, who gave verbal consent to proceed.  History of Present Illness       Latest Ref Rng & Units 12/18/2023   11:01 AM 08/21/2022   11:01 AM 11/13/2020    1:05 PM  Hepatic Function  Total Protein 6.0 - 8.3 g/dL 7.3  7.2  7.3   Albumin 3.5 - 5.2 g/dL 4.6  4.5  4.4   AST 0 - 37 U/L 115  116  52   ALT 0 - 53 U/L 219  295  123   Alk Phosphatase 39 - 117 U/L 73  101  74   Total Bilirubin 0.2 - 1.2 mg/dL 0.8  0.6  1.1         Latest Ref Rng & Units 12/18/2023   11:01 AM 08/21/2022   11:01 AM 05/08/2021    3:45 PM  CBC  WBC 4.0 - 10.5 K/uL 6.9  7.9  10.0   Hemoglobin 13.0 - 17.0 g/dL 85.1  84.0  83.3   Hematocrit 39.0 - 52.0 % 43.4  45.2  47.8   Platelets 150.0 - 400.0 K/uL 282.0  284.0  290     Labs 05/04/2019: Hepatitis A total antibody reactive.  Hepatitis B surface antigen negative.  Hepatitis B surface antibody nonreactive.  Hepatitis B core antibody negative.  Hepatitis C antibody negative. Abdominal sonogram with elastography: Labs 05/03/2021: IgG 1090  EXAM: US  ABDOMEN LIMITED - RIGHT UPPER QUADRANT   ULTRASOUND HEPATIC ELASTOGRAPHY   TECHNIQUE: Sonography of the right upper quadrant was performed. In addition, ultrasound elastography evaluation of the liver was performed. A region of interest was placed within the right lobe of the liver. Following application of a compressive sonographic pulse, tissue compressibility was assessed. Multiple assessments were performed at the selected site. Median tissue compressibility was determined. Previously, hepatic stiffness was assessed by shear wave velocity. Based on  recently published Society of Radiologists in Ultrasound consensus article, reporting is now recommended to be performed in the SI units of pressure (kiloPascals) representing hepatic stiffness/elasticity. The obtained result is compared to the published reference standards. (cACLD = compensated Advanced Chronic Liver Disease)   COMPARISON:  Renal stone CT 11/13/2020   FINDINGS: ULTRASOUND ABDOMEN LIMITED RIGHT UPPER QUADRANT   Gallbladder:   No gallstones or wall thickening visualized. No sonographic Murphy sign noted.   Common bile duct:   Diameter: 3.3 mm   Liver:   Increased echogenicity. No focal lesion. Portal vein is patent on color Doppler imaging with normal direction of blood flow towards the liver.   ULTRASOUND HEPATIC ELASTOGRAPHY   Device: Siemens Helix VTQ   Patient position: Oblique   Transducer 6C1   Number of measurements: 10   Hepatic segment:  8   Median kPa: 2.3   IQR: 0.7   IQR/Median kPa ratio: 0.3   Data quality: IQR/Median kPa ratio of 0.3 or greater indicates reduced accuracy   Diagnostic category:  < or = 5 kPa: high probability of being normal   The use of hepatic elastography is applicable to patients with viral hepatitis and non-alcoholic fatty liver disease. At this time, there is insufficient data for the referenced cut-off values  and use in other causes of liver disease, including alcoholic liver disease. Patients, however, may be assessed by elastography and serve as their own reference standard/baseline.   In patients with non-alcoholic liver disease, the values suggesting compensated advanced chronic liver disease (cACLD) may be lower, and patients may need additional testing with elasticity results of 7-9 kPa.   Please note that abnormal hepatic elasticity and shear wave velocities may also be identified in clinical settings other than with hepatic fibrosis, such as: acute hepatitis, elevated right heart and central  venous pressures including use of beta blockers, veno-occlusive disease (Budd-Chiari), infiltrative processes such as mastocytosis/amyloidosis/infiltrative tumor/lymphoma, extrahepatic cholestasis, with hyperemia in the post-prandial state, and with liver transplantation. Correlation with patient history, laboratory data, and clinical condition recommended.   Diagnostic Categories:   < or =5 kPa: high probability of being normal   < or =9 kPa: in the absence of other known clinical signs, rules out cACLD   >9 kPa and ?13 kPa: suggestive of cACLD, but needs further testing   >13 kPa: highly suggestive of cACLD   > or =17 kPa: highly suggestive of cACLD with an increased probability of clinically significant portal hypertension   IMPRESSION: ULTRASOUND RUQ:   Increased hepatic echogenicity suggestive of steatosis.   ULTRASOUND HEPATIC ELASTOGRAPHY:   Median kPa:  2.3   Diagnostic category:  < or = 5 kPa: high probability of being normal     Past Medical History:  Diagnosis Date   Allergic rhinitis 09/03/2012   Constipation 09/03/2012   Rx Miralax .  Instructions re: eat more fiber.     Eczema    Fatty liver    GERD (gastroesophageal reflux disease)    Hardening of the liver    Overweight(278.02) 09/03/2012   Past Surgical History:  Procedure Laterality Date   LIVER BIOPSY     Social History:  Family History:    reports that he has been smoking cigarettes. He has been exposed to tobacco smoke. He uses smokeless tobacco. He reports current alcohol use. He reports that he does not currently use drugs. family history includes Allergic rhinitis in his paternal aunt, paternal grandfather, paternal grandmother, and paternal uncle; Asthma in his brother; Healthy in his father and mother. Allergies[1]    Outpatient Encounter Medications as of 05/14/2024  Medication Sig   cromolyn  (OPTICROM ) 4 % ophthalmic solution Place 1 to 2 drops up to 4 times a day in each eye as  needed for itchy watery eyes   famotidine  (PEPCID ) 20 MG tablet Take 1 tablet (20 mg total) by mouth daily.   fluticasone  (FLONASE ) 50 MCG/ACT nasal spray Place 2 sprays into both nostrils daily. (Patient not taking: Reported on 12/18/2023)   levocetirizine (XYZAL ) 5 MG tablet Take 1 tablet (5 mg total) by mouth daily as needed for allergies.   triamcinolone  cream (KENALOG ) 0.1 % Apply 1 Application topically 2 (two) times daily.   No facility-administered encounter medications on file as of 05/14/2024.     REVIEW OF SYSTEMS:  Gen: Denies fever, sweats or chills. No weight loss.  CV: Denies chest pain, palpitations or edema. Resp: Denies cough, shortness of breath of hemoptysis.  GI: Denies heartburn, dysphagia, stomach or lower abdominal pain. No diarrhea or constipation.  GU: Denies urinary burning, blood in urine, increased urinary frequency or incontinence. MS: Denies joint pain, muscles aches or weakness. Derm: Denies rash, itchiness, skin lesions or unhealing ulcers. Psych: Denies depression, anxiety, memory loss or confusion. Heme: Denies bruising, easy bleeding. Neuro:  Denies headaches, dizziness or paresthesias. Endo:  Denies any problems with DM, thyroid  or adrenal function.  PHYSICAL EXAM: There were no vitals taken for this visit. General: in no acute distress. Head: Normocephalic and atraumatic. Eyes:  Sclerae non-icteric, conjunctive pink. Ears: Normal auditory acuity. Mouth: Dentition intact. No ulcers or lesions.  Neck: Supple, no lymphadenopathy or thyromegaly.  Lungs: Clear bilaterally to auscultation without wheezes, crackles or rhonchi. Heart: Regular rate and rhythm. No murmur, rub or gallop appreciated.  Abdomen: Soft, nontender, nondistended. No masses. No hepatosplenomegaly. Normoactive bowel sounds x 4 quadrants.  Rectal:  Musculoskeletal: Symmetrical with no gross deformities. Skin: Warm and dry. No rash or lesions on visible extremities. Extremities: No  edema. Neurological: Alert oriented x 4, no focal deficits.  Psychological: Alert and cooperative. Normal mood and affect.  ASSESSMENT AND PLAN:    CC:  McElwee, Lauren A, NP       [1]  Allergies Allergen Reactions   Omnicef [Cefdinir] Rash    Per mom patient has tolerated penicillin  in the past   "

## 2024-12-18 ENCOUNTER — Encounter: Admitting: Nurse Practitioner

## 2024-12-21 ENCOUNTER — Encounter: Admitting: Nurse Practitioner
